# Patient Record
Sex: Male | Born: 1980 | Race: White | Hispanic: No | Marital: Married | Smoking: Current every day smoker
Health system: Southern US, Community
[De-identification: ages and names within clinical notes are randomized; demographics above are authoritative.]

## PROBLEM LIST (undated history)

## (undated) DIAGNOSIS — I10 Essential (primary) hypertension: Secondary | ICD-10-CM

## (undated) HISTORY — PX: WRIST SURGERY: SHX841

---

## 2011-10-07 ENCOUNTER — Emergency Department (HOSPITAL_COMMUNITY): Payer: Self-pay

## 2011-10-07 ENCOUNTER — Emergency Department (HOSPITAL_COMMUNITY)
Admission: EM | Admit: 2011-10-07 | Discharge: 2011-10-07 | Disposition: A | Discharge status: Home / Self Care | Payer: Self-pay | Attending: Emergency Medicine | Admitting: Emergency Medicine

## 2011-10-07 ENCOUNTER — Encounter (HOSPITAL_COMMUNITY): Payer: Self-pay | Admitting: Emergency Medicine

## 2011-10-07 DIAGNOSIS — R000 Tachycardia, unspecified: Secondary | ICD-10-CM | POA: Insufficient documentation

## 2011-10-07 DIAGNOSIS — R0781 Pleurodynia: Secondary | ICD-10-CM

## 2011-10-07 DIAGNOSIS — X58XXXA Exposure to other specified factors, initial encounter: Secondary | ICD-10-CM | POA: Insufficient documentation

## 2011-10-07 DIAGNOSIS — S298XXA Other specified injuries of thorax, initial encounter: Secondary | ICD-10-CM | POA: Insufficient documentation

## 2011-10-07 DIAGNOSIS — Z880 Allergy status to penicillin: Secondary | ICD-10-CM | POA: Insufficient documentation

## 2011-10-07 HISTORY — DX: Essential (primary) hypertension: I10

## 2011-10-07 MED ORDER — IBUPROFEN 800 MG PO TABS
800.0000 mg | ORAL_TABLET | Freq: Once | ORAL | Status: AC
  Administered 2011-10-07: 800 mg via ORAL
  Filled 2011-10-07: qty 1

## 2011-10-07 MED ORDER — IBUPROFEN 800 MG PO TABS: 800.0000 mg | ORAL_TABLET | Freq: Three times a day (TID) | ORAL | Status: AC | PRN

## 2011-10-07 MED ORDER — OXYCODONE HCL 5 MG PO TABS: 5.0000 mg | ORAL_TABLET | Freq: Four times a day (QID) | ORAL | Status: AC | PRN

## 2011-10-07 MED ORDER — OXYCODONE HCL 5 MG PO TABS
10.0000 mg | ORAL_TABLET | Freq: Once | ORAL | Status: AC
  Administered 2011-10-07: 10 mg via ORAL
  Filled 2011-10-07: qty 2
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
## 2011-10-07 NOTE — Discharge Instructions (Signed)
Please read and follow all provided instructions.  Your diagnoses today include:  1. Rib pain    Tests performed today include:  Rib x-ray which did not show any broken bones.   Vital signs. See below for your results today.   Medications prescribed:   Oxycodone - narcotic pain medication  You have been prescribed narcotic pain medication such as Vicodin or Percocet: DO NOT drive or perform any activities that require you to be awake and alert because this medicine can make you drowsy. BE VERY CAREFUL not to take multiple medicines containing Tylenol (also called acetaminophen). Doing so can lead to an overdose which can damage your liver and cause liver failure and possibly death.    Ibuprofen - anti-inflammatory pain medication  Do not exceed 800mg  ibuprofen every 8 hours  You have been prescribed an anti-inflammatory medication or NSAID. Take with food. Take smallest effective dose for the shortest duration needed for your pain. Stop taking if you experience stomach pain or vomiting.   Take any prescribed medications only as directed.  Home care instructions:  Follow any educational materials contained in this packet.  Take 10 deep breaths every hour while awake to prevent pneumonia.   Follow-up instructions: Please follow-up with your primary care provider in the next 3 days for further evaluation of your symptoms. If you do not have a primary care doctor -- see below for referral information.   Return instructions:   Please return to the Emergency Department if you experience worsening symptoms.   Please return if you have any other emergent concerns.  Additional Information:  Your vital signs today were: BP 133/75  Pulse 111  Temp(Src) 97.9 F (36.6 C) (Oral)  Resp 20  SpO2 97% If your blood pressure (BP) was elevated above 135/85 this visit, please have this repeated by your doctor within one month. -------------- No Primary Care Doctor Call [facility]   [phone] Other agencies that provide inexpensive medical care    [facility]  [phone]    [facility]  [phone]    [facility]  [phone]    [facility]  [phone]    [facility]  [phone]    [facility]  [phone] -------------- RESOURCE GUIDE:  Dental Problems  Patients with Medicaid: [facility] [id number]                                            [phone] [address] [employer] Phone:  [phone]                                                   Phone:  [phone]  If unable to pay or uninsured, contact:  [facility] or [facility]. to become qualified for the adult dental clinic.  Chronic Pain Problems Contact [facility]  [phone] Patients need to be referred by their primary care doctor.  Insufficient Money for Medicine Contact [employer]:  call "211" or [facility] [phone].  Psychological Services [facility]  [phone] [facility]  [phone] [facility]   [phone] (emergency services [phone])  Substance Abuse Resources [facility]  [phone] [facility] [phone] [facility] [phone] Floydene Flock [phone] Residential & Outpatient Substance Abuse Program  [phone]  Abuse/Neglect [facility] Child Abuse Hotline [phone] [facility] Child Abuse Hotline [phone] (After Hours)  Emergency Shelter [facility] [phone]  Maternity Homes [facility] at the [place] [facility] [phone] [facility] [phone]  [facility]  [facility]     [employer]                          [facility] [address] [place]                       [address]      [address]  Blondell Reveal Phone:  [phone]                                   Phone:  [phone]                 Phone:  [phone]  [facility] Phone:  [phone]  [facility] Child Abuse Hotline [phone] [phone] (After Hours)
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
## 2011-10-09 NOTE — ED Provider Notes (Signed)
Medical screening examination/treatment/procedure(s) were performed by non-physician practitioner and as supervising physician I was immediately available for consultation/collaboration.   Arrow Emmerich A Rick Warnick, MD 10/09/11 0745 

## 2011-11-26 ENCOUNTER — Encounter (HOSPITAL_COMMUNITY): Payer: Self-pay | Admitting: *Deleted

## 2011-11-26 ENCOUNTER — Emergency Department (HOSPITAL_COMMUNITY)
Admission: EM | Admit: 2011-11-26 | Discharge: 2011-11-26 | Disposition: A | Discharge status: Home / Self Care | Payer: Self-pay | Attending: Emergency Medicine | Admitting: Emergency Medicine

## 2011-11-26 DIAGNOSIS — L0390 Cellulitis, unspecified: Secondary | ICD-10-CM

## 2011-11-26 DIAGNOSIS — F17200 Nicotine dependence, unspecified, uncomplicated: Secondary | ICD-10-CM | POA: Insufficient documentation

## 2011-11-26 DIAGNOSIS — L02519 Cutaneous abscess of unspecified hand: Secondary | ICD-10-CM | POA: Insufficient documentation

## 2011-11-26 DIAGNOSIS — I10 Essential (primary) hypertension: Secondary | ICD-10-CM | POA: Insufficient documentation

## 2011-11-26 MED ORDER — SULFAMETHOXAZOLE-TMP DS 800-160 MG PO TABS
1.0000 | ORAL_TABLET | Freq: Once | ORAL | Status: AC
  Administered 2011-11-26: 1 via ORAL
  Filled 2011-11-26: qty 1

## 2011-11-26 MED ORDER — SULFAMETHOXAZOLE-TRIMETHOPRIM 800-160 MG PO TABS: 1.0000 | ORAL_TABLET | Freq: Two times a day (BID) | ORAL | Status: AC

## 2011-11-26 NOTE — ED Provider Notes (Signed)
History     CSN: [id number]  Arrival date & time 11/26/11  4098   First MD Initiated Contact with Patient 11/26/11 [phone]      Chief Complaint  Patient presents with  . Insect Bite    (Consider location/radiation/quality/duration/timing/severity/associated sxs/prior treatment) HPI Comments: Patient presents with complaint of right wrist redness and mild pain that began yesterday. Patient noted a blister filled with clear fluid which he "popped". Patient has noticed increasing mild redness and warmth around that area. He denies fever, nausea, vomiting, axillary pain or lymphadenopathy. Patient has some pain with movement of his wrist but has full range of motion. Nothing makes symptoms better. Treatments prior to arrival. Onset was acute. Course is gradually worsening.  Patient is a 31 y.o. male presenting with rash. The history is provided by the patient.  Rash  This is a new problem. The current episode started yesterday. The problem has been gradually worsening. There has been no fever. The rash is present on the right wrist. The pain is mild. The pain has been constant since onset. Associated symptoms include blisters and pain. Pertinent negatives include no itching and no weeping. He has tried nothing for the symptoms. The treatment provided no relief.    Past Medical History  Diagnosis Date  . Hypertension     History reviewed. No pertinent past surgical history.  No family history on file.  History  Substance Use Topics  . Smoking status: Current Everyday Smoker  . Smokeless tobacco: Not on file  . Alcohol Use: No      Review of Systems  Constitutional: Negative for fever.  HENT: Negative for sore throat and rhinorrhea.   Eyes: Negative for redness.  Respiratory: Negative for cough.   Cardiovascular: Negative for chest pain.  Gastrointestinal: Negative for nausea, vomiting, abdominal pain and diarrhea.  Genitourinary: Negative for dysuria.  Musculoskeletal:  Negative for myalgias.  Skin: Positive for rash. Negative for itching.  Neurological: Negative for headaches.    Allergies  Penicillins and Tylenol  Home Medications   Current Outpatient Rx  Name Route Sig Dispense Refill  . GOODY HEADACHE PO Oral Take 0.5-1 packets by mouth daily as needed. For head ache    . IBUPROFEN 200 MG PO TABS Oral Take 400 mg by mouth every 8 (eight) hours as needed. For pain.    . SULFAMETHOXAZOLE-TRIMETHOPRIM 800-160 MG PO TABS Oral Take 1 tablet by mouth every 12 (twelve) hours. 14 tablet 0    BP 126/73  Pulse 95  Resp 16  SpO2 99%  Physical Exam  Nursing note and vitals reviewed. Constitutional: He appears well-developed and well-nourished.  HENT:  Head: Normocephalic and atraumatic.  Eyes: Conjunctivae are normal. Right eye exhibits no discharge. Left eye exhibits no discharge.  Neck: Normal range of motion. Neck supple.  Musculoskeletal: He exhibits tenderness.       Right wrist: He exhibits tenderness. He exhibits normal range of motion.       Less 1 cm, circular, erythematous induration with several centimeters of pink surrounding erythema that is mildly warm to touch over dorsum of R wrist.   Neurological: He is alert.  Skin: Skin is warm and dry.  Psychiatric: He has a normal mood and affect.    ED Course  Procedures (including critical care time)  Labs Reviewed - No data to display No results found.   1. Cellulitis    9:36 AM Patient seen and examined. Medications ordered.   Vital signs reviewed and are as follows:  Filed Vitals:   11/26/11 0908  BP: 126/73  Pulse: 95  Resp: 16   9:36 AM Pt urged to return with worsening pain, worsening swelling, expanding area of redness or streaking up extremity, fever, or any other concerns. Urged to take complete course of antibiotics as prescribed. Pt verbalizes understanding and agrees with plan.   MDM  Mild, localized cellulitis. No systemic symptoms. Antibiotics given in the ED and  for home. Patient given return instructions. No contraindications to outpatient therapy. No concern for cytotoxic reaction from spider bite.   [place], [place] 11/26/11 [phone]

## 2011-11-26 NOTE — ED Notes (Signed)
Pt reports he was bit by something yesterday am. Has redness/swelling to right wrist. Small amount of drainage noted.

## 2011-11-27 NOTE — ED Provider Notes (Signed)
Medical screening examination/treatment/procedure(s) were conducted as a shared visit with non-physician practitioner(s) and myself.  I personally evaluated the patient during the encounter.    Physical exam consistent with local cellulitis on right wrist. By mouth antibiotics. No I&D necessary  Donnetta Hutching, MD 11/27/11 1115

## 2011-11-28 ENCOUNTER — Emergency Department (HOSPITAL_COMMUNITY)
Admission: EM | Admit: 2011-11-28 | Discharge: 2011-11-29 | Discharge status: Against Medical Advice | Payer: Self-pay | Attending: Emergency Medicine | Admitting: Emergency Medicine

## 2011-11-28 ENCOUNTER — Encounter (HOSPITAL_COMMUNITY): Payer: Self-pay | Admitting: *Deleted

## 2011-11-28 DIAGNOSIS — M79609 Pain in unspecified limb: Secondary | ICD-10-CM | POA: Insufficient documentation

## 2011-11-28 DIAGNOSIS — M7989 Other specified soft tissue disorders: Secondary | ICD-10-CM | POA: Insufficient documentation

## 2011-11-28 NOTE — ED Notes (Signed)
Called pt twice to rechk vitals, no answer. 

## 2011-11-28 NOTE — ED Notes (Signed)
Patient reports he thought he had an ant bite on his arm on Wed,  Thursday he has swelling in the same area on the lateral right wrist.  He was seen at [facility] for same on Friday.  Patient is taking antibiotics w/o relief.  The area is raised and red,  He states the pain is so bad he cannot work or use the hand.  He has swelling noted in the hand up the anterior forearm.  Redness noted along with swelling track

## 2011-11-28 NOTE — ED Notes (Signed)
Called pt for blood draw no answer

## 2011-11-29 ENCOUNTER — Inpatient Hospital Stay (HOSPITAL_COMMUNITY)
Admission: EM | Admit: 2011-11-29 | Discharge: 2011-12-02 | DRG: 603 | Disposition: A | Discharge status: Home / Self Care | Payer: MEDICAID | Attending: Orthopaedic Surgery | Admitting: Orthopaedic Surgery

## 2011-11-29 ENCOUNTER — Encounter (HOSPITAL_COMMUNITY)
Admission: EM | Disposition: A | Discharge status: Home / Self Care | Payer: Self-pay | Source: Home / Self Care | Attending: Orthopaedic Surgery

## 2011-11-29 ENCOUNTER — Emergency Department (HOSPITAL_COMMUNITY): Payer: Self-pay | Admitting: Anesthesiology

## 2011-11-29 ENCOUNTER — Encounter (HOSPITAL_COMMUNITY): Payer: Self-pay | Admitting: Anesthesiology

## 2011-11-29 ENCOUNTER — Encounter (HOSPITAL_COMMUNITY): Payer: Self-pay | Admitting: Emergency Medicine

## 2011-11-29 DIAGNOSIS — L02519 Cutaneous abscess of unspecified hand: Principal | ICD-10-CM | POA: Diagnosis present

## 2011-11-29 DIAGNOSIS — M71031 Abscess of bursa, right wrist: Secondary | ICD-10-CM

## 2011-11-29 DIAGNOSIS — IMO0002 Reserved for concepts with insufficient information to code with codable children: Secondary | ICD-10-CM | POA: Diagnosis present

## 2011-11-29 DIAGNOSIS — Z880 Allergy status to penicillin: Secondary | ICD-10-CM

## 2011-11-29 DIAGNOSIS — I10 Essential (primary) hypertension: Secondary | ICD-10-CM | POA: Diagnosis present

## 2011-11-29 DIAGNOSIS — L03119 Cellulitis of unspecified part of limb: Principal | ICD-10-CM | POA: Diagnosis present

## 2011-11-29 DIAGNOSIS — F17200 Nicotine dependence, unspecified, uncomplicated: Secondary | ICD-10-CM | POA: Diagnosis present

## 2011-11-29 DIAGNOSIS — M25539 Pain in unspecified wrist: Secondary | ICD-10-CM | POA: Diagnosis present

## 2011-11-29 HISTORY — PX: I & D EXTREMITY: SHX5045

## 2011-11-29 LAB — COMPREHENSIVE METABOLIC PANEL
AST: 17 U/L (ref 0–37)
Alkaline Phosphatase: 84 U/L (ref 39–117)
CO2: 27 mEq/L (ref 19–32)
Chloride: 99 mEq/L (ref 96–112)
Creatinine, Ser: 0.68 mg/dL (ref 0.50–1.35)
GFR calc non Af Amer: >90 mL/min (ref >90)
Total Bilirubin: 0.3 mg/dL (ref 0.3–1.2)

## 2011-11-29 LAB — CBC WITH DIFFERENTIAL/PLATELET
Eosinophils Absolute: 0.4 10*3/uL (ref 0.0–0.7)
Eosinophils Relative: 3 % (ref 0–5)
Hemoglobin: 13.8 g/dL (ref 13.0–17.0)
Lymphs Abs: 3 10*3/uL (ref 0.7–4.0)
MCH: 29.2 pg (ref 26.0–34.0)
MCV: 85 fL (ref 78.0–100.0)
Monocytes Relative: 10 % (ref 3–12)
Neutrophils Relative %: 66 % (ref 43–77)
RBC: 4.73 MIL/uL (ref 4.22–5.81)

## 2011-11-29 SURGERY — IRRIGATION AND DEBRIDEMENT EXTREMITY
Anesthesia: General | Site: Arm Lower | Laterality: Right | Wound class: Dirty or Infected

## 2011-11-29 MED ORDER — VANCOMYCIN HCL IN DEXTROSE 1-5 GM/200ML-% IV SOLN
1000.0000 mg | Freq: Three times a day (TID) | INTRAVENOUS | Status: DC
  Administered 2011-11-29 – 2011-12-01 (×5): 1000 mg via INTRAVENOUS
  Filled 2011-11-29 (×8): qty 200

## 2011-11-29 MED ORDER — LACTATED RINGERS IV SOLN
INTRAVENOUS | Status: DC | PRN
  Administered 2011-11-29: 19:00:00 via INTRAVENOUS

## 2011-11-29 MED ORDER — METHOCARBAMOL 100 MG/ML IJ SOLN
500.0000 mg | Freq: Four times a day (QID) | INTRAMUSCULAR | Status: DC | PRN
  Filled 2011-11-29: qty 5

## 2011-11-29 MED ORDER — OXYCODONE HCL 5 MG PO TABS
5.0000 mg | ORAL_TABLET | ORAL | Status: DC | PRN
  Administered 2011-12-02 (×2): 10 mg via ORAL
  Filled 2011-11-29 (×2): qty 2

## 2011-11-29 MED ORDER — MORPHINE SULFATE 2 MG/ML IJ SOLN
1.0000 mg | INTRAMUSCULAR | Status: DC | PRN
  Administered 2011-11-29 (×2): 1 mg via INTRAVENOUS
  Filled 2011-11-29: qty 1

## 2011-11-29 MED ORDER — METOCLOPRAMIDE HCL 5 MG/ML IJ SOLN: 5.0000 mg | Freq: Three times a day (TID) | INTRAMUSCULAR | Status: DC | PRN

## 2011-11-29 MED ORDER — ONDANSETRON HCL 4 MG PO TABS: 4.0000 mg | ORAL_TABLET | Freq: Four times a day (QID) | ORAL | Status: DC | PRN

## 2011-11-29 MED ORDER — ONDANSETRON HCL 4 MG/2ML IJ SOLN
4.0000 mg | Freq: Four times a day (QID) | INTRAMUSCULAR | Status: DC | PRN
  Administered 2011-11-29: 4 mg via INTRAVENOUS
  Filled 2011-11-29: qty 2

## 2011-11-29 MED ORDER — VANCOMYCIN HCL IN DEXTROSE 1-5 GM/200ML-% IV SOLN
1000.0000 mg | Freq: Once | INTRAVENOUS | Status: AC
  Administered 2011-11-29: 1000 mg via INTRAVENOUS
  Filled 2011-11-29: qty 200

## 2011-11-29 MED ORDER — ONDANSETRON HCL 4 MG/2ML IJ SOLN
INTRAMUSCULAR | Status: DC | PRN
  Administered 2011-11-29: 4 mg via INTRAVENOUS

## 2011-11-29 MED ORDER — METHOCARBAMOL 500 MG PO TABS: 500.0000 mg | ORAL_TABLET | Freq: Four times a day (QID) | ORAL | Status: DC | PRN

## 2011-11-29 MED ORDER — LACTATED RINGERS IV SOLN: INTRAVENOUS | Status: DC

## 2011-11-29 MED ORDER — MORPHINE SULFATE 4 MG/ML IJ SOLN
4.0000 mg | Freq: Once | INTRAMUSCULAR | Status: AC
  Administered 2011-11-29: 4 mg via INTRAVENOUS
  Filled 2011-11-29: qty 1

## 2011-11-29 MED ORDER — FENTANYL CITRATE 0.05 MG/ML IJ SOLN: 25.0000 ug | INTRAMUSCULAR | Status: DC | PRN

## 2011-11-29 MED ORDER — DIPHENHYDRAMINE HCL 12.5 MG/5ML PO ELIX: 12.5000 mg | ORAL_SOLUTION | ORAL | Status: DC | PRN

## 2011-11-29 MED ORDER — ONDANSETRON HCL 4 MG/2ML IJ SOLN
4.0000 mg | Freq: Once | INTRAMUSCULAR | Status: AC
  Administered 2011-11-29: 4 mg via INTRAVENOUS
  Filled 2011-11-29: qty 2

## 2011-11-29 MED ORDER — SODIUM CHLORIDE 0.9 % IV SOLN
Freq: Once | INTRAVENOUS | Status: AC
Start: 2011-11-29 — End: 2011-11-29
  Administered 2011-11-29: 17:00:00 via INTRAVENOUS

## 2011-11-29 MED ORDER — ONDANSETRON HCL 4 MG/2ML IJ SOLN: 4.0000 mg | Freq: Four times a day (QID) | INTRAMUSCULAR | Status: DC | PRN

## 2011-11-29 MED ORDER — HYDROCODONE-ACETAMINOPHEN 5-325 MG PO TABS
1.0000 | ORAL_TABLET | ORAL | Status: DC | PRN
  Administered 2011-11-30 – 2011-12-01 (×2): 1 via ORAL
  Administered 2011-12-01: 2 via ORAL
  Filled 2011-11-29: qty 2
  Filled 2011-11-29 (×2): qty 1

## 2011-11-29 MED ORDER — METOCLOPRAMIDE HCL 5 MG/ML IJ SOLN
INTRAMUSCULAR | Status: DC | PRN
  Administered 2011-11-29: 10 mg via INTRAVENOUS

## 2011-11-29 MED ORDER — PROMETHAZINE HCL 25 MG/ML IJ SOLN: 6.2500 mg | INTRAMUSCULAR | Status: DC | PRN

## 2011-11-29 MED ORDER — ZOLPIDEM TARTRATE 5 MG PO TABS: 5.0000 mg | ORAL_TABLET | Freq: Every evening | ORAL | Status: DC | PRN

## 2011-11-29 MED ORDER — FENTANYL CITRATE 0.05 MG/ML IJ SOLN
INTRAMUSCULAR | Status: DC | PRN
  Administered 2011-11-29 (×3): 25 ug via INTRAVENOUS

## 2011-11-29 MED ORDER — ONDANSETRON HCL 4 MG/2ML IJ SOLN: 4.0000 mg | Freq: Once | INTRAMUSCULAR | Status: DC

## 2011-11-29 MED ORDER — METOCLOPRAMIDE HCL 10 MG PO TABS: 5.0000 mg | ORAL_TABLET | Freq: Three times a day (TID) | ORAL | Status: DC | PRN

## 2011-11-29 MED ORDER — MIDAZOLAM HCL 5 MG/5ML IJ SOLN
INTRAMUSCULAR | Status: DC | PRN
  Administered 2011-11-29: 2 mg via INTRAVENOUS

## 2011-11-29 MED ORDER — SODIUM CHLORIDE 0.9 % IV SOLN
INTRAVENOUS | Status: DC
  Administered 2011-11-29: 20 mL/h via INTRAVENOUS

## 2011-11-29 MED ORDER — BUPIVACAINE HCL (PF) 0.5 % IJ SOLN
INTRAMUSCULAR | Status: AC
  Filled 2011-11-29: qty 30

## 2011-11-29 MED ORDER — HYDROMORPHONE HCL PF 1 MG/ML IJ SOLN: 0.2500 mg | INTRAMUSCULAR | Status: DC | PRN

## 2011-11-29 MED ORDER — SODIUM CHLORIDE 0.9 % IR SOLN
Status: DC | PRN
  Administered 2011-11-29: 3000 mL

## 2011-11-29 MED ORDER — MORPHINE SULFATE 2 MG/ML IJ SOLN: 2.0000 mg | INTRAMUSCULAR | Status: DC | PRN

## 2011-11-29 MED ORDER — SODIUM CHLORIDE 0.9 % IR SOLN
Status: DC | PRN
  Administered 2011-11-29: 20:00:00

## 2011-11-29 SURGICAL SUPPLY — 29 items
BAG ZIPLOCK 12X15 (MISCELLANEOUS) ×2 IMPLANT
BANDAGE ESMARK 6X9 LF (GAUZE/BANDAGES/DRESSINGS) ×1 IMPLANT
BANDAGE GAUZE ELAST BULKY 4 IN (GAUZE/BANDAGES/DRESSINGS) ×2 IMPLANT
BNDG COHESIVE 4X5 TAN STRL (GAUZE/BANDAGES/DRESSINGS) ×2 IMPLANT
BNDG ESMARK 6X9 LF (GAUZE/BANDAGES/DRESSINGS) ×2
CLOTH BEACON ORANGE TIMEOUT ST (SAFETY) ×2 IMPLANT
CUFF TOURN SGL QUICK 18 (TOURNIQUET CUFF) IMPLANT
CUFF TOURN SGL QUICK 24 (TOURNIQUET CUFF)
CUFF TOURN SGL QUICK 34 (TOURNIQUET CUFF)
CUFF TRNQT CYL 24X4X40X1 (TOURNIQUET CUFF) IMPLANT
CUFF TRNQT CYL 34X4X40X1 (TOURNIQUET CUFF) IMPLANT
DRAIN PENROSE 18X1/2 LTX STRL (DRAIN) ×2 IMPLANT
DRSG PAD ABDOMINAL 8X10 ST (GAUZE/BANDAGES/DRESSINGS) ×4 IMPLANT
DURAPREP 26ML APPLICATOR (WOUND CARE) ×2 IMPLANT
ELECT REM PT RETURN 9FT ADLT (ELECTROSURGICAL) ×2
ELECTRODE REM PT RTRN 9FT ADLT (ELECTROSURGICAL) ×1 IMPLANT
GAUZE XEROFORM 1X8 LF (GAUZE/BANDAGES/DRESSINGS) ×2 IMPLANT
GLOVE BIO SURGEON STRL SZ7.5 (GLOVE) ×2 IMPLANT
GOWN STRL REIN XL XLG (GOWN DISPOSABLE) ×2 IMPLANT
HANDPIECE INTERPULSE COAX TIP (DISPOSABLE) ×1
KIT BASIN OR (CUSTOM PROCEDURE TRAY) ×2 IMPLANT
PACK LOWER EXTREMITY WL (CUSTOM PROCEDURE TRAY) ×2 IMPLANT
PAD CAST 4YDX4 CTTN HI CHSV (CAST SUPPLIES) ×1 IMPLANT
PADDING CAST COTTON 4X4 STRL (CAST SUPPLIES) ×1
POSITIONER SURGICAL ARM (MISCELLANEOUS) ×2 IMPLANT
SET HNDPC FAN SPRY TIP SCT (DISPOSABLE) ×1 IMPLANT
SPONGE GAUZE 4X4 12PLY (GAUZE/BANDAGES/DRESSINGS) ×2 IMPLANT
SYR CONTROL 10ML LL (SYRINGE) ×2 IMPLANT
TOWEL OR 17X26 10 PK STRL BLUE (TOWEL DISPOSABLE) ×4 IMPLANT

## 2011-11-29 NOTE — ED Notes (Signed)
Awaiting hand surgeon consult

## 2011-11-29 NOTE — ED Notes (Signed)
MD at bedside. 

## 2011-11-29 NOTE — ED Notes (Signed)
Pt presenting to ed with c/o abscess to right wrist pt states seen here for the same x 3 days ago but pain, redness and swelling are worse. Pt is alert and oriented at this time

## 2011-11-29 NOTE — Anesthesia Postprocedure Evaluation (Signed)
  Anesthesia Post-op Note  Patient: [name] [name]  Procedure(s) Performed: Procedure(s) (LRB): IRRIGATION AND DEBRIDEMENT EXTREMITY (Right)  Patient Location: PACU  Anesthesia Type: General  Level of Consciousness: awake and alert   Airway and Oxygen Therapy: Patient Spontanous Breathing  Post-op Pain: mild  Post-op Assessment: Post-op Vital signs reviewed, Patient's Cardiovascular Status Stable, Respiratory Function Stable, Patent Airway and No signs of Nausea or vomiting  Post-op Vital Signs: stable  Complications: No apparent anesthesia complications

## 2011-11-29 NOTE — H&P (Signed)
[name] [name] is an 31 y.o. male.   Chief Complaint:  Cellulitis and abscess of right wrist. HPI: Pt noted a blister like lesion on right wrist Thursday and burst the lesion expressing clear fluid.  On Friday the area became inflamed and painful.  He presented to the ED and was treated with oral Septra.  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  On Saturday he tried to rest and elevate hand.  Returned to the ED on Monday when the edema began extending into his hand and forearm and wound began draining pus.  Pt reports no injury to this area but thought perhaps he was bitten by an insect.  He recalls killing a spider in his hotel room earlier this week when he was working out of town.  He feels it may have bitten him.  He can only describe the spider as brown with black legs the size of his thumbnail and "it did not have a red hour glass on its back".  He also has been working outdoors in areas of tall brush.   Past Medical History  Diagnosis Date  . Hypertension     History reviewed. No pertinent past surgical history.  No family history on file. Social History:  reports that he has been smoking.  He does not have any smokeless tobacco history on file. He reports that he does not drink alcohol or use illicit drugs. Pt has an 57 month old daughter who is in his custody. He is separated from his wife.  Allergies:  Allergies  Allergen Reactions  . Penicillins Nausea And Vomiting    Turns green  . Tylenol (Acetaminophen) Nausea And Vomiting     (Not in a hospital admission)  Results for orders placed during the hospital encounter of 11/29/11 (from the past 48 hour(s))  CBC WITH DIFFERENTIAL     Status: Abnormal   Collection Time   11/29/11 12:10 PM      Component Value Range Comment   WBC 14.7 (*) 4.0 - 10.5 K/uL    RBC 4.73  4.22 - 5.81 MIL/uL    Hemoglobin 13.8  13.0 - 17.0 g/dL    HCT 62.1  30.8 - 65.7 %    MCV 85.0  78.0 - 100.0 fL    MCH 29.2  26.0 -  34.0 pg    MCHC 34.3  30.0 - 36.0 g/dL    RDW 84.6  96.2 - 95.2 %    Platelets 233  150 - 400 K/uL    Neutrophils Relative 66  43 - 77 %    Neutro Abs 9.7 (*) 1.7 - 7.7 K/uL    Lymphocytes Relative 21  12 - 46 %    Lymphs Abs 3.0  0.7 - 4.0 K/uL    Monocytes Relative 10  3 - 12 %    Monocytes Absolute 1.5 (*) 0.1 - 1.0 K/uL    Eosinophils Relative 3  0 - 5 %    Eosinophils Absolute 0.4  0.0 - 0.7 K/uL    Basophils Relative 0  0 - 1 %    Basophils Absolute 0.1  0.0 - 0.1 K/uL    WBC Morphology TOXIC GRANULATION      Smear Review PLATELET COUNT CONFIRMED BY SMEAR     COMPREHENSIVE METABOLIC PANEL     Status: Abnormal   Collection Time   11/29/11 12:10 PM      Component Value Range Comment   Sodium  136  135 - 145 mEq/L    Potassium 3.3 (*) 3.5 - 5.1 mEq/L    Chloride 99  96 - 112 mEq/L    CO2 27  19 - 32 mEq/L    Glucose, Bld 99  70 - 99 mg/dL    BUN 5 (*) 6 - 23 mg/dL    Creatinine, Ser 8.65  0.50 - 1.35 mg/dL    Calcium 9.1  8.4 - 78.4 mg/dL    Total Protein 7.2  6.0 - 8.3 g/dL    Albumin 3.7  3.5 - 5.2 g/dL    AST 17  0 - 37 U/L    ALT 14  0 - 53 U/L    Alkaline Phosphatase 84  39 - 117 U/L    Total Bilirubin 0.3  0.3 - 1.2 mg/dL    GFR calc non Af Amer >90  >90 mL/min    GFR calc Af Amer >90  >90 mL/min    No results found.  Review of Systems  Constitutional: Positive for fever.  HENT: Negative.   Eyes: Negative.   Respiratory: Negative.   Cardiovascular: Negative.   Gastrointestinal: Negative.   Genitourinary: Negative.   Musculoskeletal: Negative.   Skin:       Redness and swelling at right wrist extending into forearm and hand since 2 days ago. Tender and sore in the axilla on the right.  Neurological: Positive for tingling.       Tingling in the right thumb and fingers which has improved today and now just in the tip of the right thumb  Endo/Heme/Allergies: Negative.   Psychiatric/Behavioral: Negative.     Blood pressure 127/82, pulse 110, temperature 98.6  F (37 C), temperature source Oral, resp. rate 20, SpO2 99.00%. Physical Exam  Constitutional: He is oriented to person, place, and time. He appears well-developed and well-nourished.  HENT:  Head: Normocephalic and atraumatic.  Eyes: EOM are normal. Pupils are equal, round, and reactive to light.  Neck: Normal range of motion. Neck supple.  Cardiovascular: Normal rate and regular rhythm.   Respiratory: Effort normal and breath sounds normal.  GI: Soft. Bowel sounds are normal.  Musculoskeletal:       Right wrist with large tense area of erythema and edema. (app 5cm x 2cm).  Abscess over radial aspect of wrist which is weeping and has some purulent matter express from central area of excoriation.  Forearm with edema,but little erythema.  No streaking into the upper arm.  Tender with palpation in the right axilla.  No palpable nodes in the cervical or axillary region. Subjective decrease sensation of the right thumb tip.  Dorsum of hand edematous.  Pt can flex and extend fingers well.  Pain with flex and ext of wrist. Radial pulse intact. Capillary refill of fingers intact.  Wound dressed with xeroform gauze, 4x4's and kerlex after examination.  Neurological: He is alert and oriented to person, place, and time.  Psychiatric: He has a normal mood and affect.     Assessment/Plan Cellulitis and abscess of right wrist with extension into forearm and hand.  Early lymphangitis.  Possible spider or insect bite.  Plan Will admit pt to hospital for Irrigation and debridement of abscess today by DR Magnus Ivan.  Pt placed in mission sling for elevation and IV Vancomycin and Zosyn started per pharmacy protocol.  Kolden Dupee M 11/29/2011, 3:20 PM

## 2011-11-29 NOTE — ED Notes (Signed)
Family at bedside. 

## 2011-11-29 NOTE — Progress Notes (Signed)
ANTIBIOTIC CONSULT NOTE - INITIAL  Pharmacy Consult for Vancomycin Indication: Wrist cellulitis and abscess  Allergies  Allergen Reactions  . Penicillins Nausea And Vomiting    Turns green  . Tylenol (Acetaminophen) Nausea And Vomiting   Patient Measurements: Height: 5' 10.87" (180 cm) (noted in chart on 7/28) Weight: 184 lb 15.5 oz (83.9 kg) (Noted in chart on 11/28/11) IBW/kg (Calculated) : 74.99   Vital Signs: Temp: 98.6 F (37 C) (07/29 2120) Temp src: Oral (07/29 1138) BP: 118/79 mmHg (07/29 2120) Pulse Rate: 88  (07/29 2120) Intake/Output from previous day:   Intake/Output from this shift: Total I/O In: 600 [I.V.:600] Out: -   Labs:  Basename 11/29/11 1210  WBC 14.7*  HGB 13.8  PLT 233  LABCREA --  CREATININE 0.68   Estimated Creatinine Clearance: 141.9 ml/min (by C-G formula based on Cr of 0.68). No results found for this basename: VANCOTROUGH:2,VANCOPEAK:2,VANCORANDOM:2,GENTTROUGH:2,GENTPEAK:2,GENTRANDOM:2,TOBRATROUGH:2,TOBRAPEAK:2,TOBRARND:2,AMIKACINPEAK:2,AMIKACINTROU:2,AMIKACIN:2, in the last 72 hours   Microbiology: No results found for this or any previous visit (from the past 720 hour(s)).  Medical History: Past Medical History  Diagnosis Date  . Hypertension    Medications:  Anti-infectives     Start     Dose/Rate Route Frequency Ordered Stop   11/29/11 1949   polymyxin B 500,000 Units, bacitracin 50,000 Units in sodium chloride irrigation 0.9 % 500 mL irrigation  Status:  Discontinued          As needed 11/29/11 1950 11/29/11 2006   11/29/11 1300   vancomycin (VANCOCIN) IVPB 1000 mg/200 mL premix        1,000 mg 200 mL/hr over 60 Minutes Intravenous  Once 11/29/11 1247 11/29/11 1505         Assessment:  31yo M with right wrist cellulitis and abscess, possible spider or insect bite. Underwent I&D 7/29.  Starting empiric Vancomycin.  Good renal fxn.  Goal of Therapy:  Vancomycin trough level 15-20 mcg/ml  Plan:  Vancomycin 1g IV  q8h. Measure Vanc trough at steady state. Follow up renal fxn and culture results.  Charolotte Eke, PharmD, pager [phone]. 11/29/2011,10:02 PM.

## 2011-11-29 NOTE — Transfer of Care (Signed)
Immediate Anesthesia Transfer of Care Note  Patient: [name] [name]  Procedure(s) Performed: Procedure(s) (LRB): IRRIGATION AND DEBRIDEMENT EXTREMITY (Right)  Patient Location: PACU  Anesthesia Type: General  Level of Consciousness: sedated, patient cooperative and responds to stimulaton  Airway & Oxygen Therapy: Patient Spontanous Breathing and Patient connected to face mask oxgen  Post-op Assessment: Report given to PACU RN and Post -op Vital signs reviewed and stable  Post vital signs: Reviewed and stable  Complications: No apparent anesthesia complications

## 2011-11-29 NOTE — ED Notes (Signed)
Patient is resting comfortably. 

## 2011-11-29 NOTE — Anesthesia Preprocedure Evaluation (Addendum)
Anesthesia Evaluation  Patient identified by MRN, date of birth, ID band Patient awake    Reviewed: Allergy & Precautions, H&P , NPO status , Patient's Chart, lab work & pertinent test results, reviewed documented beta blocker date and time   Airway Mallampati: II TM Distance: >3 FB Neck ROM: full    Dental No notable dental hx.    Pulmonary neg pulmonary ROS,  breath sounds clear to auscultation  Pulmonary exam normal       Cardiovascular Exercise Tolerance: Good hypertension, Rhythm:regular Rate:Normal     Neuro/Psych negative neurological ROS  negative psych ROS   GI/Hepatic negative GI ROS, Neg liver ROS,   Endo/Other  negative endocrine ROS  Renal/GU negative Renal ROS  negative genitourinary   Musculoskeletal   Abdominal   Peds  Hematology negative hematology ROS (+)   Anesthesia Other Findings   Reproductive/Obstetrics negative OB ROS                           Anesthesia Physical Anesthesia Plan  ASA: II  Anesthesia Plan: General   Post-op Pain Management:    Induction:   Airway Management Planned:   Additional Equipment:   Intra-op Plan:   Post-operative Plan:   Informed Consent: I have reviewed the patients History and Physical, chart, labs and discussed the procedure including the risks, benefits and alternatives for the proposed anesthesia with the patient or authorized representative who has indicated his/her understanding and acceptance.   Dental Advisory Given  Plan Discussed with: CRNA  Anesthesia Plan Comments:         Anesthesia Quick Evaluation

## 2011-11-29 NOTE — ED Provider Notes (Signed)
History     CSN: [id number]  Arrival date & time 11/29/11  1133   First MD Initiated Contact with Patient 11/29/11 1210      Chief Complaint  Patient presents with  . Abscess    (Consider location/radiation/quality/duration/timing/severity/associated sxs/prior treatment) HPI History from patient. 31 year old right-handed male who presents with pain and swelling to his right wrist. He states this started on Thursday. He has had progressive pain, swelling, and redness since that time. He was seen in the emergency department on Friday. He did not have an area amenable to drainage at that time and was started on Bactrim for cellulitis. He reports that he has been taking this as prescribed. He reports that the area has had increased swelling since then and he noticed a red area of streaking going up his arm last evening. He reports that he did make a small cut in the area at home himself last night due to the pressure and pain. This was productive of a small amount of serous drainage. He has not noticed any overtly purulent drainage from the area. He denies fever or chills. States that he did see a spider in his hotel room tonight before noticing the area and is worried that he may been bitten by it.  Past Medical History  Diagnosis Date  . Hypertension     History reviewed. No pertinent past surgical history.  No family history on file.  History  Substance Use Topics  . Smoking status: Current Everyday Smoker  . Smokeless tobacco: Not on file  . Alcohol Use: No      Review of Systems as per HPI  Allergies  Penicillins and Tylenol  Home Medications   Current Outpatient Rx  Name Route Sig Dispense Refill  . GOODY HEADACHE PO Oral Take 0.5-1 packets by mouth daily as needed. For head ache    . IBUPROFEN 200 MG PO TABS Oral Take 400 mg by mouth every 8 (eight) hours as needed. For pain.    . SULFAMETHOXAZOLE-TRIMETHOPRIM 800-160 MG PO TABS Oral Take 1 tablet by mouth every 12  (twelve) hours. 14 tablet 0    BP 127/82  Pulse 110  Temp 98.6 F (37 C) (Oral)  Resp 20  SpO2 99%  Physical Exam  Nursing note and vitals reviewed. Constitutional: He appears well-developed and well-nourished. No distress.  HENT:  Head: Normocephalic and atraumatic.  Neck: Normal range of motion.  Cardiovascular: Tachycardia present.   Pulmonary/Chest: Effort normal.  Musculoskeletal: Normal range of motion.       Large approx 5 cm raised area without obvious fluctuance draining serous material with overlying erythema to radial side of wrist; tender to palpation - pt has generalized edema to wrist and dorsum of hand. NVI to hand in radian, median, ulnar dist, endorses some difficulty moving fingers as states his hand feels "tight" and is painful. Subtle lymphangitis extending nearly to axilla.  Neurological: He is alert.  Skin: Skin is warm and dry. He is not diaphoretic.  Psychiatric: He has a normal mood and affect.    ED Course  Procedures (including critical care time)  Labs Reviewed  CBC WITH DIFFERENTIAL - Abnormal; Notable for the following:    WBC 14.7 (*)     Neutro Abs 9.7 (*)     Monocytes Absolute 1.5 (*)     All other components within normal limits  COMPREHENSIVE METABOLIC PANEL - Abnormal; Notable for the following:    Potassium 3.3 (*)     BUN  5 (*)     All other components within normal limits   No results found.   1. Abscess of bursa, right wrist       MDM  12:48 PM Discussed with Dr. Magnus Ivan. He will see the patient. Pt made aware, resting comfortably at this time. Will manage pain and give IV abx while awaiting consult.   4:00 PM Pt seen by PA, will go to OR for incision and drainage.        Grant Fontana, PA-C 11/29/11 1821

## 2011-11-29 NOTE — ED Notes (Addendum)
Pt said having a lot of pain

## 2011-11-29 NOTE — ED Notes (Signed)
Patient is resting comfortably.  Awaiting dr. Magnus Ivan

## 2011-11-29 NOTE — ED Provider Notes (Addendum)
He complains pain and swelling at right wrist on exam there is approximately 5 cm diameter swollen reddened tender area at radial aspect of wrist patient has pain on active motion of his fingers. Dorsum of right hand is swollen mildly tender. Radial pulse 2+  Doug Sou, MD 11/29/11 1225  Doug Sou, MD 11/29/11 1719

## 2011-11-30 ENCOUNTER — Encounter (HOSPITAL_COMMUNITY): Payer: Self-pay | Admitting: Orthopaedic Surgery

## 2011-11-30 LAB — CBC
MCH: 29.5 pg (ref 26.0–34.0)
MCHC: 34.6 g/dL (ref 30.0–36.0)
MCV: 85.3 fL (ref 78.0–100.0)
Platelets: 215 10*3/uL (ref 150–400)
RBC: 4.95 MIL/uL (ref 4.22–5.81)

## 2011-11-30 LAB — BASIC METABOLIC PANEL
BUN: 4 mg/dL — ABNORMAL LOW (ref 6–23)
CO2: 24 mEq/L (ref 19–32)
Calcium: 9.4 mg/dL (ref 8.4–10.5)
Creatinine, Ser: 0.65 mg/dL (ref 0.50–1.35)
Glucose, Bld: 104 mg/dL — ABNORMAL HIGH (ref 70–99)
Sodium: 135 mEq/L (ref 135–145)

## 2011-11-30 NOTE — Op Note (Signed)
NAME:  SEAB, AXEL NO.:  [id number]  MEDICAL RECORD NO.:  [id number]  LOCATION:  1618                         FACILITY:  [facility]  PHYSICIAN:  Vanita Panda. Magnus Ivan, M.D.DATE OF BIRTH:  [birth year]  DATE OF PROCEDURE:  11/29/2011 DATE OF DISCHARGE:                              OPERATIVE REPORT   PREOPERATIVE DIAGNOSIS:  Right wrist cellulitis and abscess.  POSTOPERATIVE DIAGNOSIS:  Right wrist cellulitis and abscess.  FINDINGS:  Local soft tissue abscess, right radial wrist.  SURGEON:  Vanita Panda. Magnus Ivan, M.D.  ANESTHESIA:  General.  BLOOD LOSS:  Minimal.  TOURNIQUET TIME:  8 minutes.  COMPLICATIONS:  None.  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  He feels like he may have attained some type of spider bite to his radial wrist, which is dominant wrist.  He went to the ER on Friday and they started him on Bactrim double strength.  He returned to the ER today because his wrist had gotten more fluctuant, painful, and red and was started to have pain going up into his axilla.  Orthopedic Surgery was consulted to address the abscess.  On exam, he did have fluctuant area on the radial aspect of his wrist and I felt like he would benefit from IV antibiotics and a formal irrigation and debridement.  PROCEDURE DESCRIPTION:  After informed consent was obtained, appropriate right wrist was marked.  He was brought to the operating range of motion, placed supine on the operative table with the right arm on the arm table.  General anesthesia was then obtained.  A nonsterile tourniquet was placed on his upper right arm and his right hand and wrist, and forearm were prepped and draped with Hibiclens and sterile drapes.  A time-out was called to identify correct patient, correct right wrist.  I then raised the arm and elevated the tourniquet to 250 mmHg.  I made an incision directly over this large  fluctuant area on the radial border of his right wrist, and did encounter gross purulence and necrotic tissue.  We obtained cultures although he had been given a dose of IV vancomycin in the ER.  I then used 3 L of normal saline solution and pulsatile lavage to lavage the wrist followed by 500 mL of bacitracin solution.  I then loosely reapproximated the skin edges with interrupted nylon suture.  I placed Xeroform and well- padded sterile dressing.  He was awakened, extubated, and taken to the recovery room in a stable condition.  All final counts were correct and there were no complications noted.  Postoperatively, we will admit him for IV antibiotics for at least the next 24-48 hours as he is clearing the infection and we can see what the cultures show.     Vanita Panda. Magnus Ivan, M.D.     CYB/MEDQ  D:  11/29/2011  T:  11/30/2011  Job:  [id number]

## 2011-11-30 NOTE — ED Provider Notes (Signed)
Medical screening examination/treatment/procedure(s) were conducted as a shared visit with non-physician practitioner(s) and myself.  I personally evaluated the patient during the encounter  Doug Sou, MD 11/30/11 [phone]

## 2011-11-30 NOTE — Progress Notes (Signed)
Subjective: 1 Day Post-Op Procedure(s) (LRB): IRRIGATION AND DEBRIDEMENT EXTREMITY (Right) Patient reports pain as moderate.  Post I&D of wrist abscess.  Objective: Vital signs in last 24 hours: Temp:  [97.5 F (36.4 C)-99.2 F (37.3 C)] 99.2 F (37.3 C) (07/30 0520) Pulse Rate:  [75-110] 75  (07/30 0520) Resp:  [9-20] 18  (07/30 0520) BP: (100-139)/(62-84) 100/62 mmHg (07/30 0520) SpO2:  [91 %-100 %] 100 % (07/30 0520) Weight:  [83.9 kg (184 lb 15.5 oz)] 83.9 kg (184 lb 15.5 oz) (07/29 2139)  Intake/Output from previous day: 07/29 0701 - 07/30 0700 In: 2010.3 [P.O.:1120; I.V.:690.3; IV Piggyback:200] Out: 900 [Urine:900] Intake/Output this shift: Total I/O In: 2010.3 [P.O.:1120; I.V.:690.3; IV Piggyback:200] Out: 900 [Urine:900]   Basename 11/30/11 0420 11/29/11 1210  HGB 14.6 13.8    Basename 11/30/11 0420 11/29/11 1210  WBC 13.8* 14.7*  RBC 4.95 4.73  HCT 42.2 40.2  PLT 215 233    Basename 11/30/11 0420 11/29/11 1210  NA 135 136  K 4.0 3.3*  CL 100 99  CO2 24 27  BUN 4* 5*  CREATININE 0.65 0.68  GLUCOSE 104* 99  CALCIUM 9.4 9.1   No results found for this basename: LABPT:2,INR:2 in the last 72 hours  Incision: dressing C/D/I  Assessment/Plan: 1 Day Post-Op Procedure(s) (LRB): IRRIGATION AND DEBRIDEMENT EXTREMITY (Right) IV antibiotics the next 24 hours prior to d/c on oral anitbiotics.   Kathryne Hitch 11/30/2011, 6:38 AM

## 2011-12-01 LAB — BASIC METABOLIC PANEL
CO2: 25 mEq/L (ref 19–32)
Calcium: 9.3 mg/dL (ref 8.4–10.5)
Creatinine, Ser: 0.64 mg/dL (ref 0.50–1.35)
GFR calc non Af Amer: >90 mL/min (ref >90)
Glucose, Bld: 128 mg/dL — ABNORMAL HIGH (ref 70–99)

## 2011-12-01 MED ORDER — CLINDAMYCIN HCL 300 MG PO CAPS
600.0000 mg | ORAL_CAPSULE | Freq: Three times a day (TID) | ORAL | Status: DC
  Administered 2011-12-01 – 2011-12-02 (×3): 600 mg via ORAL
  Filled 2011-12-01 (×6): qty 2

## 2011-12-01 NOTE — Progress Notes (Signed)
CARE MANAGEMENT NOTE 12/01/2011  Patient:  [name] [name],[name] [name]   Account Number:  [id number]  Date Initiated:  11/30/2011  Documentation initiated by:  Colleen Can  Subjective/Objective Assessment:   dx cellulitis and abscess of lt wrist. Insect or spider bite     Action/Plan:   CM spoke with patient. Plans to go back to hotel room where he lives alone Pt is without health insurance but works full time job   Anticipated DC Date:  12/02/2011   Anticipated DC Plan:  HOME/SELF CARE  In-house referral  Financial Counselor      DC Planning Services  CM consult      Choice offered to / List presented to:             Status of service:  In process, will continue to follow Per UR Regulation:  Reviewed for med. necessity/level of care/duration of stay

## 2011-12-01 NOTE — Progress Notes (Signed)
Patient ID: [name] [name], male   DOB: [birth year], 31 y.o.   MRN: [id number] Dressing changed at the bedside.  Right wrist wound improving.  No red streaking up his arm like before.  Still some drainage.  Fingers less swollen.  New dressing applied.  Plan:  Conitnue IV antibiotics today and then likely D/C to home tomorrow am 8/1.

## 2011-12-02 LAB — CULTURE, ROUTINE-ABSCESS

## 2011-12-02 LAB — BASIC METABOLIC PANEL
BUN: 10 mg/dL (ref 6–23)
Calcium: 9.1 mg/dL (ref 8.4–10.5)
GFR calc non Af Amer: >90 mL/min (ref >90)
Glucose, Bld: 107 mg/dL — ABNORMAL HIGH (ref 70–99)
Sodium: 137 mEq/L (ref 135–145)

## 2011-12-02 MED ORDER — DOXYCYCLINE HYCLATE 100 MG PO TABS: 100.0000 mg | ORAL_TABLET | Freq: Two times a day (BID) | ORAL | Status: AC

## 2011-12-02 MED ORDER — OXYCODONE HCL 5 MG PO TABS: 5.0000 mg | ORAL_TABLET | Freq: Four times a day (QID) | ORAL | Status: AC | PRN

## 2011-12-02 NOTE — Discharge Summary (Signed)
Patient ID: [name] [name] MRN: [id number] DOB/AGE: 1980/06/14 31 y.o.  Admit date: 11/29/2011 Discharge date: 12/02/2011  Admission Diagnoses:  Principal Problem:  *Abscess or cellulitis of wrist   Discharge Diagnoses:  Same  Past Medical History  Diagnosis Date  . Hypertension     Surgeries: Procedure(s): IRRIGATION AND DEBRIDEMENT EXTREMITY on 11/29/2011   Consultants:    Discharged Condition: Improved  Hospital Course: [name] [name] is an 31 y.o. male who was admitted 11/29/2011 for operative treatment ofAbscess or cellulitis of wrist. Patient has severe unremitting pain that affects sleep, daily activities, and work/hobbies. After pre-op clearance the patient was taken to the operating room on 11/29/2011 and underwent  Procedure(s): IRRIGATION AND DEBRIDEMENT EXTREMITY.    Patient was given perioperative antibiotics: Anti-infectives     Start     Dose/Rate Route Frequency Ordered Stop   12/02/11 0000   doxycycline (VIBRA-TABS) 100 MG tablet        100 mg Oral 2 times daily 12/02/11 0700 12/12/11 2359   12/15/11 1630   clindamycin (CLEOCIN) capsule 600 mg        600 mg Oral 3 times per day Dec 15, 2011 1546     11/29/11 2230   vancomycin (VANCOCIN) IVPB 1000 mg/200 mL premix  Status:  Discontinued        1,000 mg 200 mL/hr over 60 Minutes Intravenous Every 8 hours 11/29/11 2200 12-15-11 1549   11/29/11 1949   polymyxin B 500,000 Units, bacitracin 50,000 Units in sodium chloride irrigation 0.9 % 500 mL irrigation  Status:  Discontinued          As needed 11/29/11 1950 11/29/11 2006   11/29/11 1300   vancomycin (VANCOCIN) IVPB 1000 mg/200 mL premix        1,000 mg 200 mL/hr over 60 Minutes Intravenous  Once 11/29/11 1247 11/29/11 1505           Patient was given sequential compression devices, early ambulation, and chemoprophylaxis to prevent DVT.  Patient benefited maximally from hospital stay and there were no complications.    Recent vital signs: Patient Vitals for  the past 24 hrs:  BP Temp Temp src Pulse Resp SpO2  12/02/11 0430 114/68 mmHg 97.6 F (36.4 C) Oral 71  16  94 %  12/15/2011 2213 122/63 mmHg 98.5 F (36.9 C) Oral 73  16  94 %  12-15-2011 1425 112/62 mmHg 98.3 F (36.8 C) - 90  16  100 %     Recent laboratory studies:  Basename 12/02/11 0415 2011-12-15 0355 11/30/11 0420 11/29/11 1210  WBC -- -- 13.8* 14.7*  HGB -- -- 14.6 13.8  HCT -- -- 42.2 40.2  PLT -- -- 215 233  NA 137 136 -- --  K 3.9 3.8 -- --  CL 103 101 -- --  CO2 26 25 -- --  BUN 10 10 -- --  CREATININE 0.71 0.64 -- --  GLUCOSE 107* 128* -- --  INR -- -- -- --  CALCIUM 9.1 -- -- --     Discharge Medications:   Medication List  As of 12/02/2011  7:00 AM   TAKE these medications         doxycycline 100 MG tablet   Commonly known as: VIBRA-TABS   Take 1 tablet (100 mg total) by mouth 2 (two) times daily.      GOODY HEADACHE PO   Take 0.5-1 packets by mouth daily as needed. For head ache      ibuprofen 200 MG tablet  Commonly known as: ADVIL,MOTRIN   Take 400 mg by mouth every 8 (eight) hours as needed. For pain.      oxyCODONE 5 MG immediate release tablet   Commonly known as: Oxy IR/ROXICODONE   Take 1 tablet (5 mg total) by mouth every 6 (six) hours as needed for pain.      sulfamethoxazole-trimethoprim 800-160 MG per tablet   Commonly known as: BACTRIM DS,SEPTRA DS   Take 1 tablet by mouth every 12 (twelve) hours.            Diagnostic Studies: No results found.  Disposition: 01-Home or Self Care  Discharge Orders    Future Orders Please Complete By Expires   Diet - low sodium heart healthy      Call MD / Call 911      Comments:   If you experience chest pain or shortness of breath, CALL 911 and be transported to the hospital emergency room.  If you develope a fever above 101 F, pus (white drainage) or increased drainage or redness at the wound, or calf pain, call your surgeon's office.   Constipation Prevention      Comments:   Drink plenty of  fluids.  Prune juice may be helpful.  You may use a stool softener, such as Colace (over the counter) 100 mg twice a day.  Use MiraLax (over the counter) for constipation as needed.   Increase activity slowly as tolerated      Discharge instructions      Comments:   Get your right wrist wound wet daily either letting warm shower water cleanse your wound daily or even soaking your wrist in antibacterial soapy water for 15-20 minutes daily. Then apply dressing with yellow xeroform and dry dressing. Stay out of work thru next week. Call [facility] and Dr. Magnus Ivan for an appointment in 1 weeks.   Discharge patient            Signed: Kathryne Hitch 12/02/2011, 7:00 AM

## 2011-12-02 NOTE — Care Management Note (Signed)
    Page 1 of 2   12/02/2011     3:17:31 PM   CARE MANAGEMENT NOTE 12/02/2011  Patient:  [name],[name]   Account Number:  [id number]  Date Initiated:  11/30/2011  Documentation initiated by:  Colleen Can  Subjective/Objective Assessment:   dx cellulitis and abscess of lt wrist. Insect or spider bite     Action/Plan:   CM spoke with patient. Plans to go back to hotel room where he lives alone Pt is without health insurance but works full time job   Anticipated DC Date:  12/02/2011   Anticipated DC Plan:  HOME/SELF CARE  In-house referral  Financial Counselor      DC Planning Services  CM consult      PAC Choice  NA   Choice offered to / List presented to:  NA   DME arranged  NA      DME agency  NA     HH arranged  NA      Status of service:  Completed, signed off Medicare Important Message given?  NO (If response is "NO", the following Medicare IM given date fields will be blank) Date Medicare IM given:   Date Additional Medicare IM given:    Discharge Disposition:  HOME/SELF CARE  Per UR Regulation:  Reviewed for med. necessity/level of care/duration of stay  If discussed at Long Length of Stay Meetings, dates discussed:    Comments:

## 2011-12-02 NOTE — Progress Notes (Signed)
Patient ID: [name] [name], male   DOB: [birth year], 31 y.o.   MRN: [id number] Doing well.  Can discharge today.  Wound is healing.

## 2011-12-04 LAB — ANAEROBIC CULTURE

## 2011-12-27 LAB — FUNGUS CULTURE W SMEAR: Fungal Smear: NONE SEEN

## 2012-01-11 LAB — AFB CULTURE WITH SMEAR (NOT AT ARMC): Acid Fast Smear: NONE SEEN

## 2012-02-11 ENCOUNTER — Emergency Department (HOSPITAL_COMMUNITY)
Admission: EM | Admit: 2012-02-11 | Discharge: 2012-02-11 | Disposition: A | Discharge status: Home / Self Care | Payer: Self-pay | Attending: Emergency Medicine | Admitting: Emergency Medicine

## 2012-02-11 ENCOUNTER — Encounter (HOSPITAL_COMMUNITY): Payer: Self-pay | Admitting: *Deleted

## 2012-02-11 ENCOUNTER — Emergency Department (HOSPITAL_COMMUNITY): Payer: Self-pay

## 2012-02-11 DIAGNOSIS — S43499A Other sprain of unspecified shoulder joint, initial encounter: Secondary | ICD-10-CM | POA: Insufficient documentation

## 2012-02-11 DIAGNOSIS — S46919A Strain of unspecified muscle, fascia and tendon at shoulder and upper arm level, unspecified arm, initial encounter: Secondary | ICD-10-CM

## 2012-02-11 DIAGNOSIS — Z880 Allergy status to penicillin: Secondary | ICD-10-CM | POA: Insufficient documentation

## 2012-02-11 DIAGNOSIS — I10 Essential (primary) hypertension: Secondary | ICD-10-CM | POA: Insufficient documentation

## 2012-02-11 DIAGNOSIS — F17200 Nicotine dependence, unspecified, uncomplicated: Secondary | ICD-10-CM | POA: Insufficient documentation

## 2012-02-11 DIAGNOSIS — Z884 Allergy status to anesthetic agent status: Secondary | ICD-10-CM | POA: Insufficient documentation

## 2012-02-11 DIAGNOSIS — X500XXA Overexertion from strenuous movement or load, initial encounter: Secondary | ICD-10-CM | POA: Insufficient documentation

## 2012-02-11 MED ORDER — HYDROCODONE-ACETAMINOPHEN 5-325 MG PO TABS: 1.0000 | ORAL_TABLET | Freq: Four times a day (QID) | ORAL | Status: DC | PRN

## 2012-02-11 MED ORDER — ONDANSETRON 8 MG PO TBDP
8.0000 mg | ORAL_TABLET | Freq: Once | ORAL | Status: AC
  Administered 2012-02-11: 8 mg via ORAL
  Filled 2012-02-11: qty 1

## 2012-02-11 MED ORDER — OXYCODONE-ACETAMINOPHEN 5-325 MG PO TABS
1.0000 | ORAL_TABLET | Freq: Once | ORAL | Status: DC
  Filled 2012-02-11: qty 1

## 2012-02-11 MED ORDER — IBUPROFEN 800 MG PO TABS
800.0000 mg | ORAL_TABLET | Freq: Once | ORAL | Status: AC
  Administered 2012-02-11: 800 mg via ORAL
  Filled 2012-02-11: qty 1

## 2012-02-11 MED ORDER — NAPROXEN 500 MG PO TABS: 500.0000 mg | ORAL_TABLET | Freq: Two times a day (BID) | ORAL | Status: DC

## 2012-02-11 NOTE — ED Provider Notes (Signed)
Medical screening examination/treatment/procedure(s) were performed by non-physician practitioner and as supervising physician I was immediately available for consultation/collaboration.  John-Adam Tiffannie Sloss, M.D.     John-Adam Antinette Keough, MD 02/11/12 1817 

## 2012-02-11 NOTE — ED Notes (Signed)
Pt arrives ambulatory to [facility] from home. sts that he was trying to move a couch approximately 2 hours ago and that the couch slipped out of his hand and he injured his right shoulder trying to catch it. Patient sts he took 1 goodys powder to try to alleviate the pain with no relief. sts that he has had previous problems with this shoulder and is currently seeing a physician for cortisone shots to same shoulder.

## 2012-02-11 NOTE — ED Provider Notes (Signed)
History     CSN: [id number]  Arrival date & time 02/11/12  1638   First MD Initiated Contact with Patient 02/11/12 1642      Chief Complaint  Patient presents with  . Shoulder Pain    (Consider location/radiation/quality/duration/timing/severity/associated sxs/prior treatment) HPI Comments: 31 year old male with a history of right shoulder rotator cuff issues presents emergency department complaining of right shoulder pain.  He states that he was moving a large couch when it slipped out of his hands.  Patient states that he attempted to catch it quickly causing strain to his right shoulder.  Since the incident patient complains of pain with range of motion.  Symptoms severity is mild to moderate, exacerbating factors include any motion, patient attempted to take Goody's powder without relief.  Patient denies any numbness tingling or decreased sensation of extremity.  No other complaints at this time.  Patient is a 31 y.o. male presenting with shoulder pain. The history is provided by the patient.  Shoulder Pain Associated symptoms include arthralgias. Pertinent negatives include no congestion, coughing, diaphoresis, fever, headaches, myalgias or neck pain.    Past Medical History  Diagnosis Date  . Hypertension     Past Surgical History  Procedure Date  . I&d extremity 11/29/2011    Procedure: IRRIGATION AND DEBRIDEMENT EXTREMITY;  Surgeon: Kathryne Hitch, MD;  Location: [facility];  Service: Orthopedics;  Laterality: Right;  right wrist abscess  . Wrist surgery     History reviewed. No pertinent family history.  History  Substance Use Topics  . Smoking status: Current Every Day Smoker  . Smokeless tobacco: Not on file  . Alcohol Use: No      Review of Systems  Constitutional: Negative for fever, diaphoresis and activity change.  HENT: Negative for congestion and neck pain.   Respiratory: Negative for cough.   Genitourinary: Negative for dysuria.    Musculoskeletal: Positive for arthralgias. Negative for myalgias.  Skin: Negative for color change and wound.  Neurological: Negative for headaches.  All other systems reviewed and are negative.    Allergies  Penicillins and Tylenol  Home Medications   Current Outpatient Rx  Name Route Sig Dispense Refill  . GOODY HEADACHE PO Oral Take 0.5-1 packets by mouth daily as needed. For head ache    . IBUPROFEN 200 MG PO TABS Oral Take 400 mg by mouth every 8 (eight) hours as needed. For pain.      BP 136/84  Pulse 88  Temp 98.6 F (37 C) (Oral)  Resp 16  Ht 5\' 11"  (1.803 m)  Wt 185 lb (83.915 kg)  BMI 25.80 kg/m2  SpO2 100%  Physical Exam  Nursing note and vitals reviewed. Constitutional: He appears well-developed and well-nourished. No distress.  HENT:  Head: Normocephalic and atraumatic.  Eyes: Conjunctivae normal and EOM are normal.  Neck: Normal range of motion. Neck supple.  Cardiovascular:       Intact distal pulses, capillary refill < 3 seconds  Musculoskeletal:       Tenderness to palpation over right rotator cuff muscles.  No labrum tenderness.  Crepitus felt with range of motion.  Pain induced with both active and passive range of motion. All other extremities with normal ROM  Neurological:       No sensory deficit  Skin: He is not diaphoretic.       Skin intact, no tenting    ED Course  Procedures (including critical care time)  Labs Reviewed - No data to display Dg  Shoulder Right  02/11/2012  *RADIOLOGY REPORT*  Clinical Data: Right shoulder pain.  Lifting injury.  History of torn rotator cuff.  RIGHT SHOULDER - 2+ VIEW  Comparison: None.  Findings: No fracture or dislocation.  No abnormal soft tissue calcifications.  Minimal apical pleural thickening without associated bony destruction.  Visualized lungs otherwise clear.  IMPRESSION: No acute abnormality.  Please see above.   Original Report Authenticated By: Fuller Canada, M.D.      No diagnosis  found.    MDM  Patient X-Ray negative for obvious fracture or dislocation. Pain managed in ED. Pt advised to follow up with his orthopedic if conservative home therapies to not improve symptoms to assess for possible rotator cuff tear.  Patient will be dc home & is agreeable with above plan.   Jaci Carrel, [place] 02/11/12 1800

## 2012-02-11 NOTE — ED Notes (Signed)
Patient given discharge instructions, information, prescriptions, and diet order. Patient states that they adequately understand discharge information given and to return to ED if symptoms return or worsen.     

## 2012-09-06 ENCOUNTER — Emergency Department (HOSPITAL_COMMUNITY)
Admission: EM | Admit: 2012-09-06 | Discharge: 2012-09-06 | Disposition: A | Discharge status: Home / Self Care | Payer: Self-pay | Attending: Emergency Medicine | Admitting: Emergency Medicine

## 2012-09-06 ENCOUNTER — Encounter (HOSPITAL_COMMUNITY): Payer: Self-pay | Admitting: *Deleted

## 2012-09-06 DIAGNOSIS — F17200 Nicotine dependence, unspecified, uncomplicated: Secondary | ICD-10-CM | POA: Insufficient documentation

## 2012-09-06 DIAGNOSIS — N509 Disorder of male genital organs, unspecified: Secondary | ICD-10-CM | POA: Insufficient documentation

## 2012-09-06 DIAGNOSIS — I10 Essential (primary) hypertension: Secondary | ICD-10-CM | POA: Insufficient documentation

## 2012-09-06 DIAGNOSIS — Z880 Allergy status to penicillin: Secondary | ICD-10-CM | POA: Insufficient documentation

## 2012-09-06 DIAGNOSIS — N342 Other urethritis: Secondary | ICD-10-CM | POA: Insufficient documentation

## 2012-09-06 LAB — URINALYSIS, ROUTINE W REFLEX MICROSCOPIC
Glucose, UA: NEGATIVE mg/dL
Nitrite: NEGATIVE
Specific Gravity, Urine: 1.024 (ref 1.005–1.030)
pH: 7.5 (ref 5.0–8.0)

## 2012-09-06 LAB — URINE MICROSCOPIC-ADD ON

## 2012-09-06 MED ORDER — AZITHROMYCIN 250 MG PO TABS
1000.0000 mg | ORAL_TABLET | Freq: Once | ORAL | Status: AC
  Administered 2012-09-06: 1000 mg via ORAL
  Filled 2012-09-06: qty 4

## 2012-09-06 MED ORDER — CEFTRIAXONE SODIUM 250 MG IJ SOLR
250.0000 mg | Freq: Once | INTRAMUSCULAR | Status: AC
  Administered 2012-09-06: 250 mg via INTRAMUSCULAR
  Filled 2012-09-06: qty 250

## 2012-09-06 MED ORDER — LIDOCAINE HCL 1 % IJ SOLN
INTRAMUSCULAR | Status: AC
  Administered 2012-09-06: 23:00:00
  Filled 2012-09-06: qty 20

## 2012-09-06 NOTE — ED Provider Notes (Signed)
History    This chart was scribed for [name] Crumble, PA working with [name] Razor, MD by ED Scribe, Burman Nieves. This patient was seen in room WTR8/WTR8 and the patient's care was started at 8:23 PM.   CSN: [id number]  Arrival date & time 09/06/12  1847   First MD Initiated Contact with Patient 09/06/12 1923      Chief Complaint  Patient presents with  . Penile Discharge    (Consider location/radiation/quality/duration/timing/severity/associated sxs/prior treatment) Patient is a 32 y.o. male presenting with penile discharge. The history is provided by the patient. No language interpreter was used.  Penile Discharge This is a new problem. The current episode started 2 days ago. The problem has not changed since onset.  HPI Comments: [name] is a 32 y.o. male who presents to the Emergency Department complaining of moderate constant penile pain and penile discharge that has been going on for 2 days now. He states that it is a yellow puss like discharge and reports pain to the head of his penis. Pt denies fever, chills, cough, nausea, vomiting, diarrhea, SOB, weakness, and any other associated symptoms. Pt denies any sexual activity in the past month. Pt is allergic to penicillins and tylenol.      Past Medical History  Diagnosis Date  . Hypertension     Past Surgical History  Procedure Laterality Date  . I&d extremity  11/29/2011    Procedure: IRRIGATION AND DEBRIDEMENT EXTREMITY;  Surgeon: Kathryne Hitch, MD;  Location: [facility];  Service: Orthopedics;  Laterality: Right;  right wrist abscess  . Wrist surgery      History reviewed. No pertinent family history.  History  Substance Use Topics  . Smoking status: Current Every Day Smoker    Types: Cigarettes  . Smokeless tobacco: Not on file  . Alcohol Use: No      Review of Systems  Genitourinary: Positive for discharge and penile pain.  All other systems reviewed and are negative.    Allergies   Penicillins and Tylenol  Home Medications  No current outpatient prescriptions on file.  BP 121/68  Pulse 85  Temp(Src) 97.8 F (36.6 C) (Oral)  Resp 20  SpO2 98%  Physical Exam  Nursing note and vitals reviewed. Constitutional: He is oriented to person, place, and time. He appears well-developed and well-nourished. No distress.  HENT:  Head: Normocephalic and atraumatic.  Eyes: EOM are normal.  Neck: Neck supple. No tracheal deviation present.  Cardiovascular: Normal rate, regular rhythm and normal heart sounds.   Pulmonary/Chest: Effort normal. No respiratory distress.  Abdominal: Soft. Bowel sounds are normal. He exhibits no distension. There is no tenderness. There is no rebound.  No CVA tenderness  Genitourinary: Testes normal.  Normal appearing penis with yellow discharge of the penis at the meatus  Musculoskeletal: Normal range of motion.  Neurological: He is alert and oriented to person, place, and time.  Skin: Skin is warm and dry.  Psychiatric: He has a normal mood and affect. His behavior is normal.    ED Course  Procedures (including critical care time) DIAGNOSTIC STUDIES: Oxygen Saturation is 98% on room air, normal by my interpretation.    COORDINATION OF CARE:  8:40 PM Discussed ED treatment with pt and pt agrees.    Results for orders placed during the hospital encounter of 09/06/12  URINALYSIS, ROUTINE W REFLEX MICROSCOPIC      Result Value Range   Color, Urine YELLOW  YELLOW   APPearance TURBID (*) CLEAR  Specific Gravity, Urine 1.024  1.005 - 1.030   pH 7.5  5.0 - 8.0   Glucose, UA NEGATIVE  NEGATIVE mg/dL   Hgb urine dipstick NEGATIVE  NEGATIVE   Bilirubin Urine SMALL (*) NEGATIVE   Ketones, ur NEGATIVE  NEGATIVE mg/dL   Protein, ur NEGATIVE  NEGATIVE mg/dL   Urobilinogen, UA 1.0  0.0 - 1.0 mg/dL   Nitrite NEGATIVE  NEGATIVE   Leukocytes, UA MODERATE (*) NEGATIVE  URINE MICROSCOPIC-ADD ON      Result Value Range   Squamous Epithelial /  LPF FEW (*) RARE   WBC, UA 3-6  <3 WBC/hpf   Bacteria, UA RARE  RARE   Urine-Other AMORPHOUS URATES/PHOSPHATES     No results found.    1. Urethritis       MDM  Pt with yellow penile discharge, dysuria. Denies sexual intercourse for the last month. UA moderate leukocytes, however no bacteria and no elevated WBCs. Pt does have yellow penile discharge on exam. i have covered him with rocephin 250mg  IM and zithromax 1g PO for possible STI given his exam findings. Sent urine cultures. Pt is to follow up outpatient.    Filed Vitals:   09/06/12 1903  BP: 121/68  Pulse: 85  Temp: 97.8 F (36.6 C)  TempSrc: Oral  Resp: 20  SpO2: 98%    I personally performed the services described in this documentation, which was scribed in my presence. The recorded information has been reviewed and is accurate.    Lottie Mussel, PA-C 09/07/12 [phone]

## 2012-09-06 NOTE — ED Notes (Signed)
Pt c/o penile discharge x's 2 days ago. Denies exposure to STD. Reports pus-type drainage and pain to head of penis.

## 2012-09-07 LAB — GC/CHLAMYDIA PROBE AMP
CT Probe RNA: POSITIVE — AB
GC Probe RNA: POSITIVE — AB

## 2012-09-08 ENCOUNTER — Telehealth (HOSPITAL_COMMUNITY): Payer: Self-pay | Admitting: Emergency Medicine

## 2012-09-08 NOTE — ED Notes (Signed)
+  Gonorrhea. +Chlamydia. Patient treated with Rocephin and Zithromax. [employer] faxed. 

## 2012-09-08 NOTE — ED Notes (Signed)
Patient has +Gonorrhea and +Chlamydia.

## 2012-09-09 NOTE — ED Provider Notes (Signed)
Medical screening examination/treatment/procedure(s) were performed by non-physician practitioner and as supervising physician I was immediately available for consultation/collaboration.  Daissy Yerian, MD 09/09/12 1602 

## 2013-12-11 ENCOUNTER — Emergency Department (HOSPITAL_COMMUNITY): Payer: 59

## 2013-12-11 ENCOUNTER — Emergency Department (HOSPITAL_COMMUNITY)
Admission: EM | Admit: 2013-12-11 | Discharge: 2013-12-11 | Disposition: A | Discharge status: Home / Self Care | Payer: 59 | Attending: Emergency Medicine | Admitting: Emergency Medicine

## 2013-12-11 DIAGNOSIS — I10 Essential (primary) hypertension: Secondary | ICD-10-CM | POA: Diagnosis not present

## 2013-12-11 DIAGNOSIS — Y9389 Activity, other specified: Secondary | ICD-10-CM | POA: Insufficient documentation

## 2013-12-11 DIAGNOSIS — F17200 Nicotine dependence, unspecified, uncomplicated: Secondary | ICD-10-CM | POA: Insufficient documentation

## 2013-12-11 DIAGNOSIS — Z880 Allergy status to penicillin: Secondary | ICD-10-CM | POA: Diagnosis not present

## 2013-12-11 DIAGNOSIS — W268XXA Contact with other sharp object(s), not elsewhere classified, initial encounter: Secondary | ICD-10-CM | POA: Insufficient documentation

## 2013-12-11 DIAGNOSIS — S51809A Unspecified open wound of unspecified forearm, initial encounter: Secondary | ICD-10-CM | POA: Diagnosis not present

## 2013-12-11 DIAGNOSIS — S51812A Laceration without foreign body of left forearm, initial encounter: Secondary | ICD-10-CM

## 2013-12-11 DIAGNOSIS — Y929 Unspecified place or not applicable: Secondary | ICD-10-CM | POA: Insufficient documentation

## 2013-12-11 MED ORDER — CEPHALEXIN 500 MG PO CAPS: 500.0000 mg | ORAL_CAPSULE | Freq: Three times a day (TID) | ORAL | Status: AC

## 2013-12-11 MED ORDER — LIDOCAINE-EPINEPHRINE (PF) 2 %-1:200000 IJ SOLN
INTRAMUSCULAR | Status: AC
  Administered 2013-12-11: 08:00:00
  Filled 2013-12-11: qty 10
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
## 2013-12-11 NOTE — ED Notes (Signed)
MD at bedside. 

## 2013-12-11 NOTE — Discharge Instructions (Signed)

## 2013-12-11 NOTE — ED Provider Notes (Signed)
CSN: [id number]     Arrival date & time 12/11/13  9326 History   First MD Initiated Contact with Patient 12/11/13 [phone]     Chief Complaint  Patient presents with  . Extremity Laceration      HPI Patient presents to the emergency department because of a laceration to his left anterior forearm.  He states he had a box on his right knee and was opening a box with a box cutter when he accidentally struck his left forearm with a blade.  He states there was rather significant bleeding initially.  He feels as though it sprayed up.  He reports some tingling around his thumb but denies pain.  He can flex his wrist and he can oppose his thumb and little finger.  He has no weakness of his hand.  Symptoms are mild in severity.  Bleeding controlled by pressure by EMS.   Past Medical History  Diagnosis Date  . Hypertension    Past Surgical History  Procedure Laterality Date  . I&d extremity  11/29/2011    Procedure: IRRIGATION AND DEBRIDEMENT EXTREMITY;  Surgeon: Mcarthur Rossetti, MD;  Location: [facility];  Service: Orthopedics;  Laterality: Right;  right wrist abscess  . Wrist surgery     No family history on file. History  Substance Use Topics  . Smoking status: Current Every Day Smoker    Types: Cigarettes  . Smokeless tobacco: Not on file  . Alcohol Use: No    Review of Systems  All other systems reviewed and are negative.     Allergies  Penicillins and Tylenol  Home Medications   Prior to Admission medications   Not on File   BP 106/84  Pulse 93  Temp(Src) 98.1 F (36.7 C) (Oral)  Resp 18  SpO2 98% Physical Exam  Nursing note and vitals reviewed. Constitutional: He is oriented to person, place, and time. He appears well-developed and well-nourished.  HENT:  Head: Normocephalic.  Eyes: EOM are normal.  Neck: Normal range of motion.  Pulmonary/Chest: Effort normal.  Abdominal: He exhibits no distension.  Musculoskeletal: Normal range of motion.  1.5 cm laceration  of his left distal third forearm on the anterior radial aspect.  Normal palpable left radial pulse and strong dopplerable triphasic pulse noted in the distal left radial artery.  Normal perfusion to his hand.  Normal flexion and extension at his left wrist.  Able to oppose his thumb and little finger.  Some changes in sensation in the radial nerve distribution.  No active bleeding or laceration.  Wound probed and bottom of the wound met. Unable to visualize the bottom of the wound.   Neurological: He is alert and oriented to person, place, and time.  Psychiatric: He has a normal mood and affect.    ED Course  Procedures (including critical care time)  LACERATION REPAIR Performed by: Hoy Morn Consent: Verbal consent obtained. Risks and benefits: risks, benefits and alternatives were discussed Patient identity confirmed: provided demographic data Time out performed prior to procedure Prepped and Draped in normal sterile fashion Wound explored Laceration Location: left anterior forearm Laceration Length: 1.5cm No Foreign Bodies seen or palpated Anesthesia: local infiltration Local anesthetic: lidocaine 2% with epinephrine Anesthetic total: 5 ml Irrigation method: syringe Amount of cleaning: standard Skin closure: 3-0 prolene Number of sutures or staples: 2 Technique: simple interrupted Patient tolerance: Patient tolerated the procedure well with no immediate complications.   Labs Review Labs Reviewed - No data to display  Imaging Review Dg  Forearm Left  12/11/2013   CLINICAL DATA:  Laceration left forearm.  Question foreign body.  EXAM: LEFT FOREARM - 2 VIEW  COMPARISON:  None.  FINDINGS: No fracture, dislocation or radiopaque foreign body is identified.  IMPRESSION: Negative for foreign body.  Negative exam.   Electronically Signed   By: Inge Rise M.D.   On: 12/11/2013 08:19  I personally reviewed the imaging tests through PACS system I reviewed available  ER/hospitalization records through the EMR    EKG Interpretation None      MDM   Final diagnoses:  Laceration of left forearm, initial encounter    Laceration repaired.  Low suspicion for radial artery injury.  I explored the wound at length and it did not seem very deep.  He continues to have some radial nerve distribution sensation issues without significant weakness.  I have asked the patient to followup with his orthopedic surgeon Dr. Ninfa Linden who he has seen before later this week for reevaluation of his laceration and his radial nerve function.  Tetanus updated.  Wound cleaned.  Patient be placed on a short course of antibiotics        Hoy Morn, MD 12/11/13 [phone]

## 2014-01-22 IMAGING — CR DG RIBS W/ CHEST 3+V*R*
3 series · 3 of 3 positions shown · non-contrast
Comparison: None.

CLINICAL DATA: Right upper anterior rib pain.  Injury.

RIGHT RIBS AND CHEST - 3+ VIEW

[w chest pa]
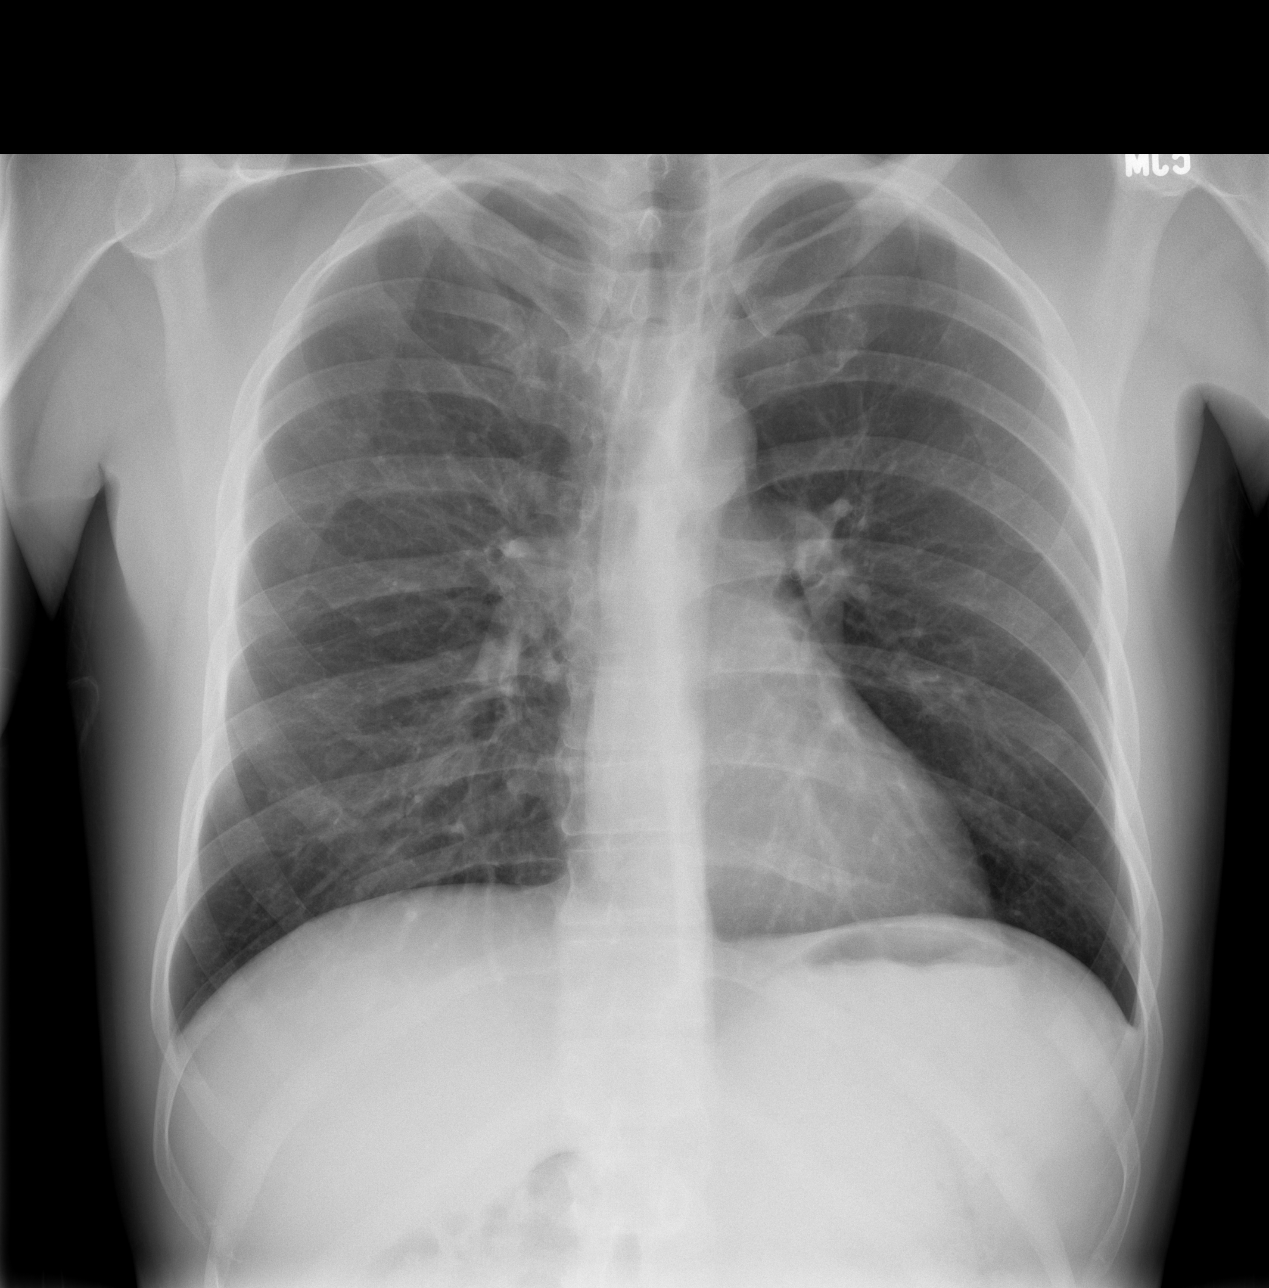

[w ribs ap/pa upper right]
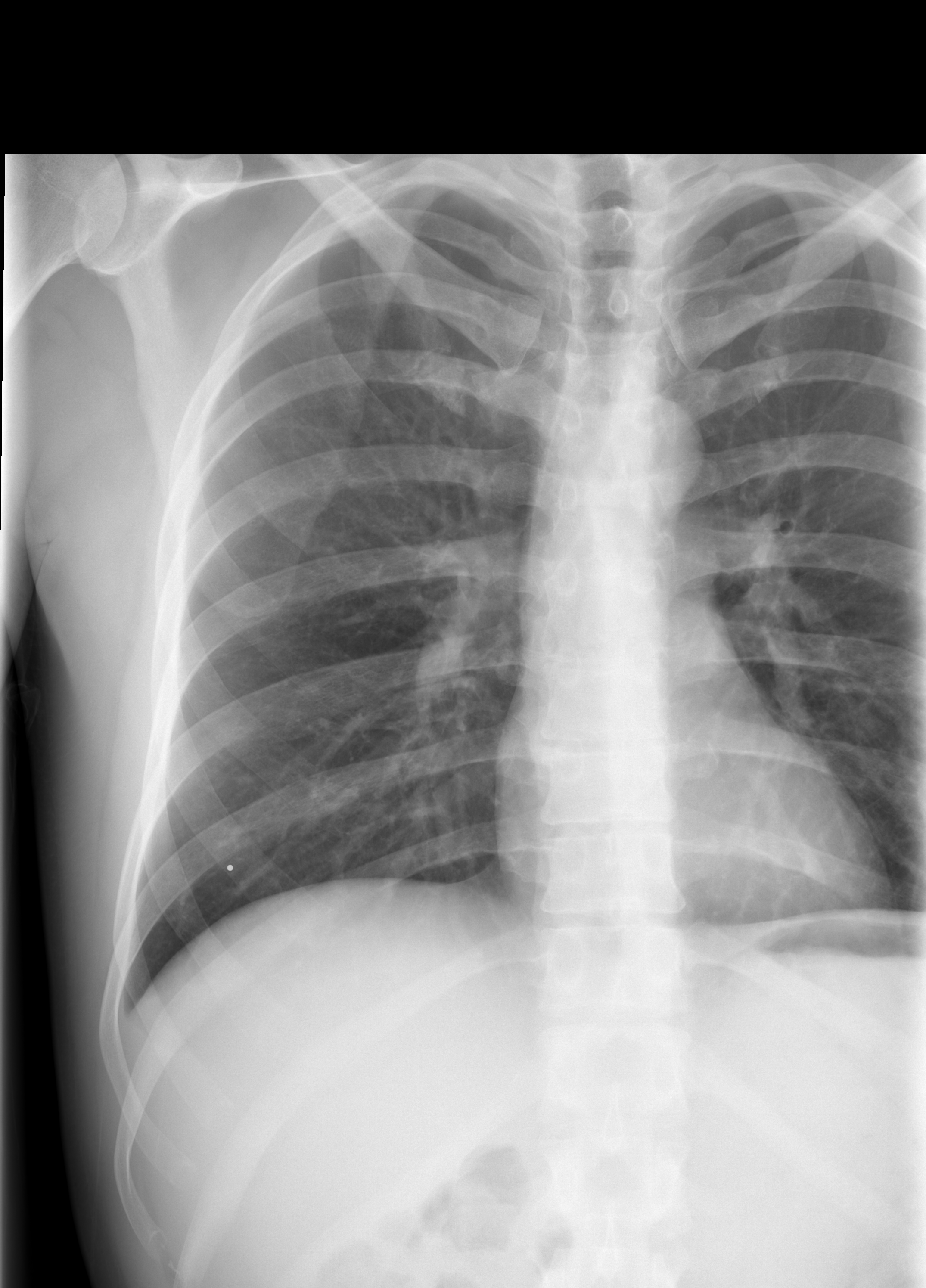

[w ribs oblique right]
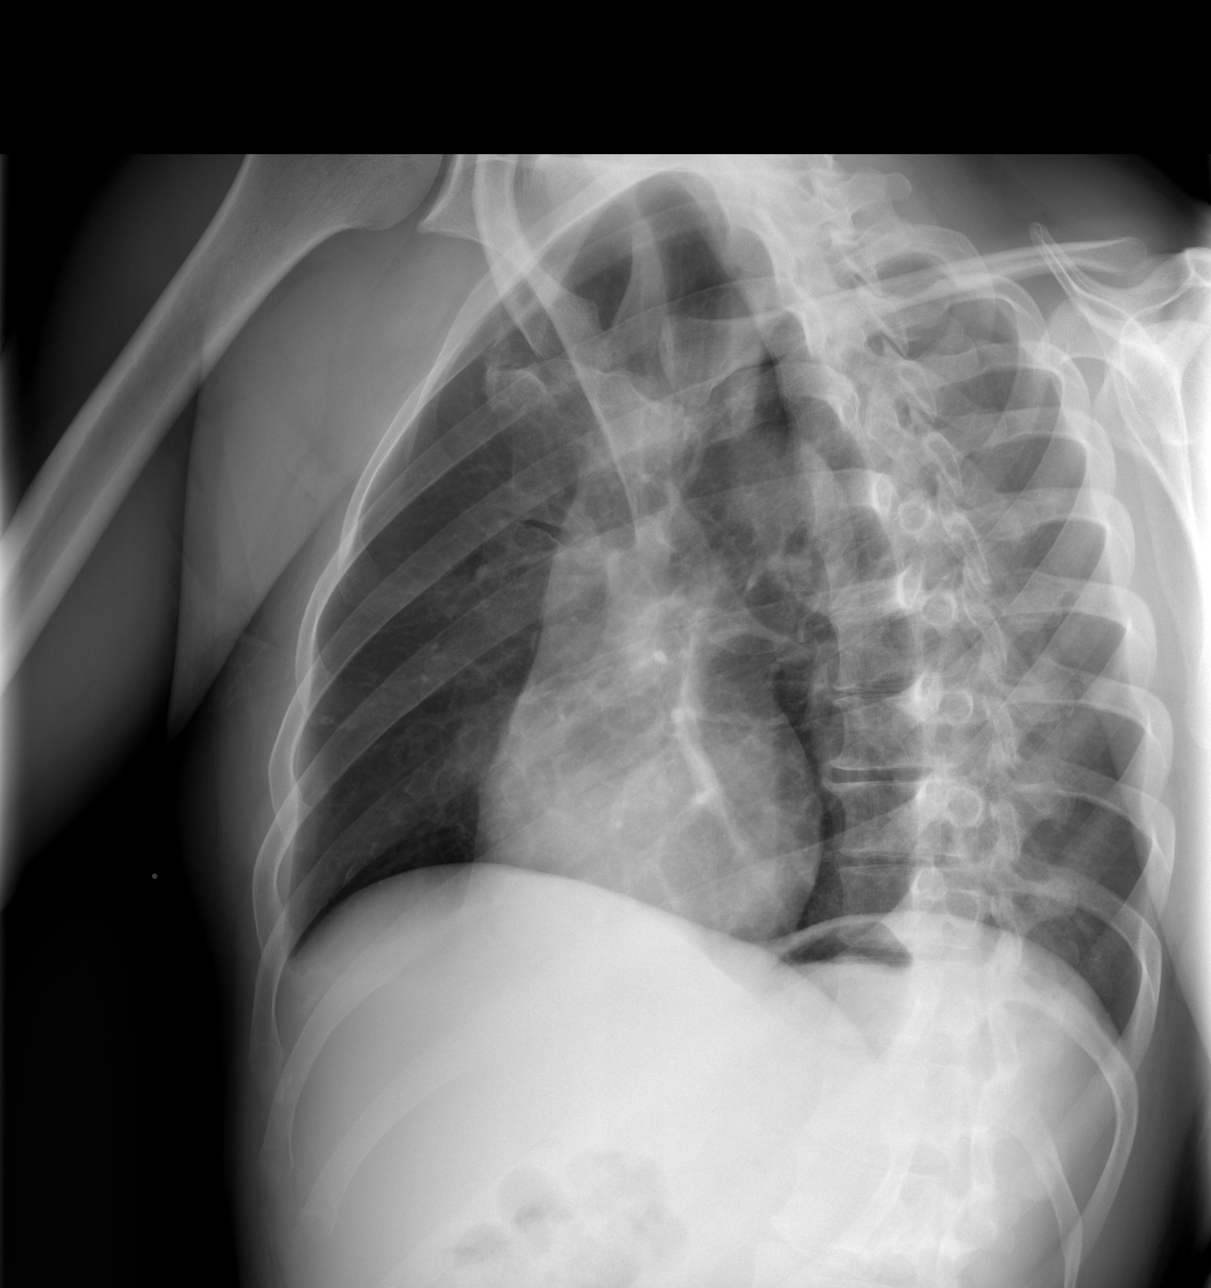

[3 of 3 positions shown; findings below may reference images not displayed]

FINDINGS: Heart and mediastinal contours are within normal limits.
No focal opacities or effusions.  No acute bony abnormality.  No
rib fracture or pneumothorax.
IMPRESSION: No active cardiopulmonary disease.

## 2014-02-07 ENCOUNTER — Emergency Department (HOSPITAL_COMMUNITY): Payer: 59

## 2014-02-07 ENCOUNTER — Emergency Department (HOSPITAL_COMMUNITY)
Admission: EM | Admit: 2014-02-07 | Discharge: 2014-02-07 | Disposition: A | Discharge status: Home / Self Care | Payer: 59 | Attending: Emergency Medicine | Admitting: Emergency Medicine

## 2014-02-07 ENCOUNTER — Encounter (HOSPITAL_COMMUNITY): Payer: Self-pay | Admitting: Emergency Medicine

## 2014-02-07 DIAGNOSIS — Z792 Long term (current) use of antibiotics: Secondary | ICD-10-CM | POA: Insufficient documentation

## 2014-02-07 DIAGNOSIS — Z880 Allergy status to penicillin: Secondary | ICD-10-CM | POA: Insufficient documentation

## 2014-02-07 DIAGNOSIS — Y9389 Activity, other specified: Secondary | ICD-10-CM | POA: Insufficient documentation

## 2014-02-07 DIAGNOSIS — S199XXA Unspecified injury of neck, initial encounter: Secondary | ICD-10-CM | POA: Insufficient documentation

## 2014-02-07 DIAGNOSIS — M542 Cervicalgia: Secondary | ICD-10-CM

## 2014-02-07 DIAGNOSIS — Y92410 Unspecified street and highway as the place of occurrence of the external cause: Secondary | ICD-10-CM | POA: Insufficient documentation

## 2014-02-07 DIAGNOSIS — Z720 Tobacco use: Secondary | ICD-10-CM | POA: Insufficient documentation

## 2014-02-07 DIAGNOSIS — I10 Essential (primary) hypertension: Secondary | ICD-10-CM | POA: Insufficient documentation

## 2014-02-07 DIAGNOSIS — S4991XA Unspecified injury of right shoulder and upper arm, initial encounter: Secondary | ICD-10-CM | POA: Insufficient documentation

## 2014-02-07 DIAGNOSIS — M25511 Pain in right shoulder: Secondary | ICD-10-CM

## 2014-02-07 MED ORDER — IBUPROFEN 800 MG PO TABS
800.0000 mg | ORAL_TABLET | Freq: Once | ORAL | Status: AC
  Administered 2014-02-07: 800 mg via ORAL
  Filled 2014-02-07: qty 1

## 2014-02-07 MED ORDER — MELOXICAM 7.5 MG PO TABS: 7.5000 mg | ORAL_TABLET | Freq: Every day | ORAL | Status: AC
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
## 2014-02-07 NOTE — ED Notes (Signed)
Pt requesting to be re-evaluted for pain management

## 2014-02-07 NOTE — Discharge Instructions (Signed)
Acromioclavicular Injuries °The acromioclavicular (AC) joint is the joint in the shoulder. There are many bands of tissue (ligaments) that surround the AC bones and joints. These bands of tissue can tear, which can lead to sprains and separations. The bones of the AC joint can also break (fracture).  °HOME CARE  °· Put ice on the injured area. °¨ Put ice in a plastic bag. °¨ Place a towel between your skin and the bag. °¨ Leave the ice on for 15-20 minutes, 03-04 times a day. °· Wear your sling as told by your doctor. Remove the sling before showering and bathing. Keep the shoulder in the same place as when the sling is on. Do not lift the arm. °· Gently tighten your figure-eight splint (if applied) every day. Tighten it enough to keep the shoulders held back. There should be room to place your finger between your body and the strap. Loosen the splint right away if you lose feeling (numbness) or have tingling in your hands. °· Only take medicine as told by your doctor. °· Keep all follow-up visits with your doctor. °GET HELP RIGHT AWAY IF:  °· Your medicine does not help your pain. °· You have more puffiness (swelling) or your bruising gets worse rather than better. °· You were unable to follow up as told by your doctor. °· You have tingling or lose even more feeling in your arm, forearm, or hand. °· Your arm is cold or pale. °· You have more pain in the hand, forearm, or fingers. °MAKE SURE YOU:  °· Understand these instructions. °· Will watch your condition. °· Will get help right away if you are not doing well or get worse. °Document Released: 10/07/2009 Document Revised: 07/12/2011 Document Reviewed: 10/07/2009 °[employer]® Patient Information ©2015 [employer]. This information is not intended to replace advice given to you by your health care provider. Make sure you discuss any questions you have with your health care provider. ° °
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
## 2014-02-08 NOTE — ED Provider Notes (Signed)
Medical screening examination/treatment/procedure(s) were performed by non-physician practitioner and as supervising physician I was immediately available for consultation/collaboration.   EKG Interpretation None        Everline Mahaffy, MD 02/08/14 1247 

## 2014-05-29 IMAGING — CR DG SHOULDER 2+V*R*
3 series · 3 of 3 positions shown · non-contrast
Comparison: None.

CLINICAL DATA: Right shoulder pain.  Lifting injury.  History of
torn rotator cuff.

RIGHT SHOULDER - 2+ VIEW

[w shoulder external right]
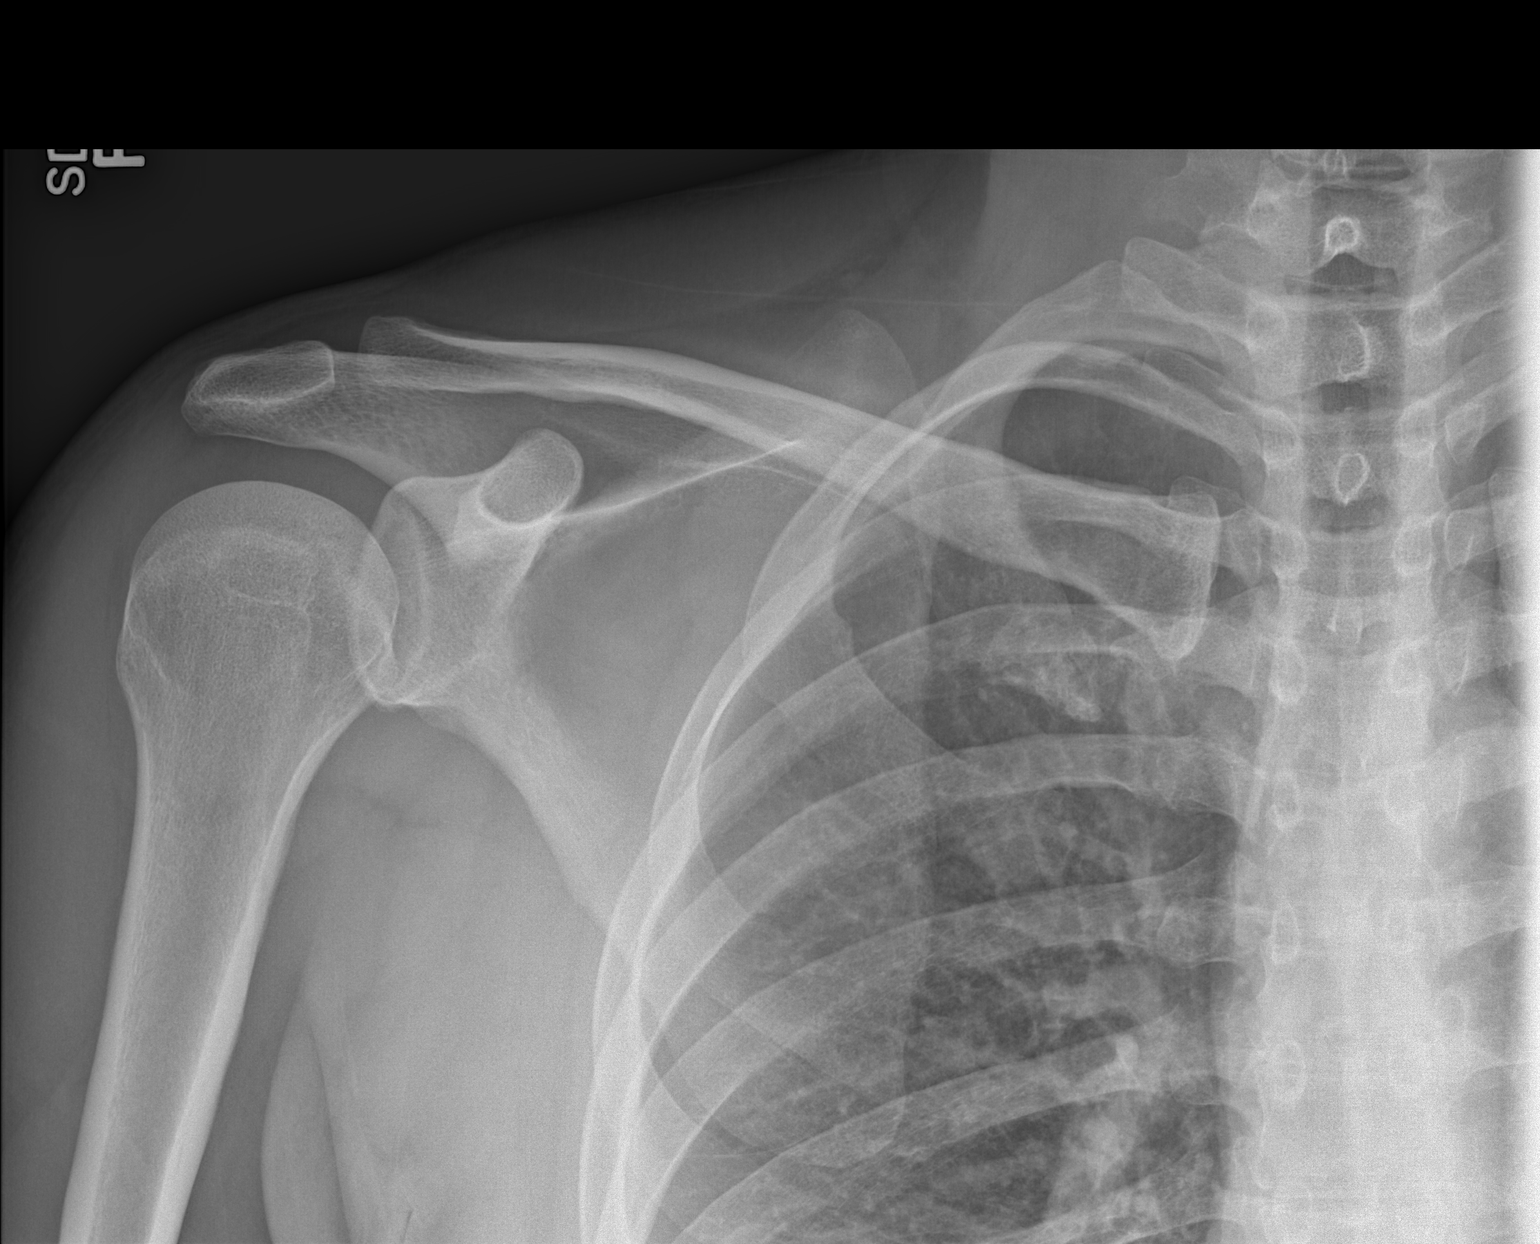

[w shoulder internal right]
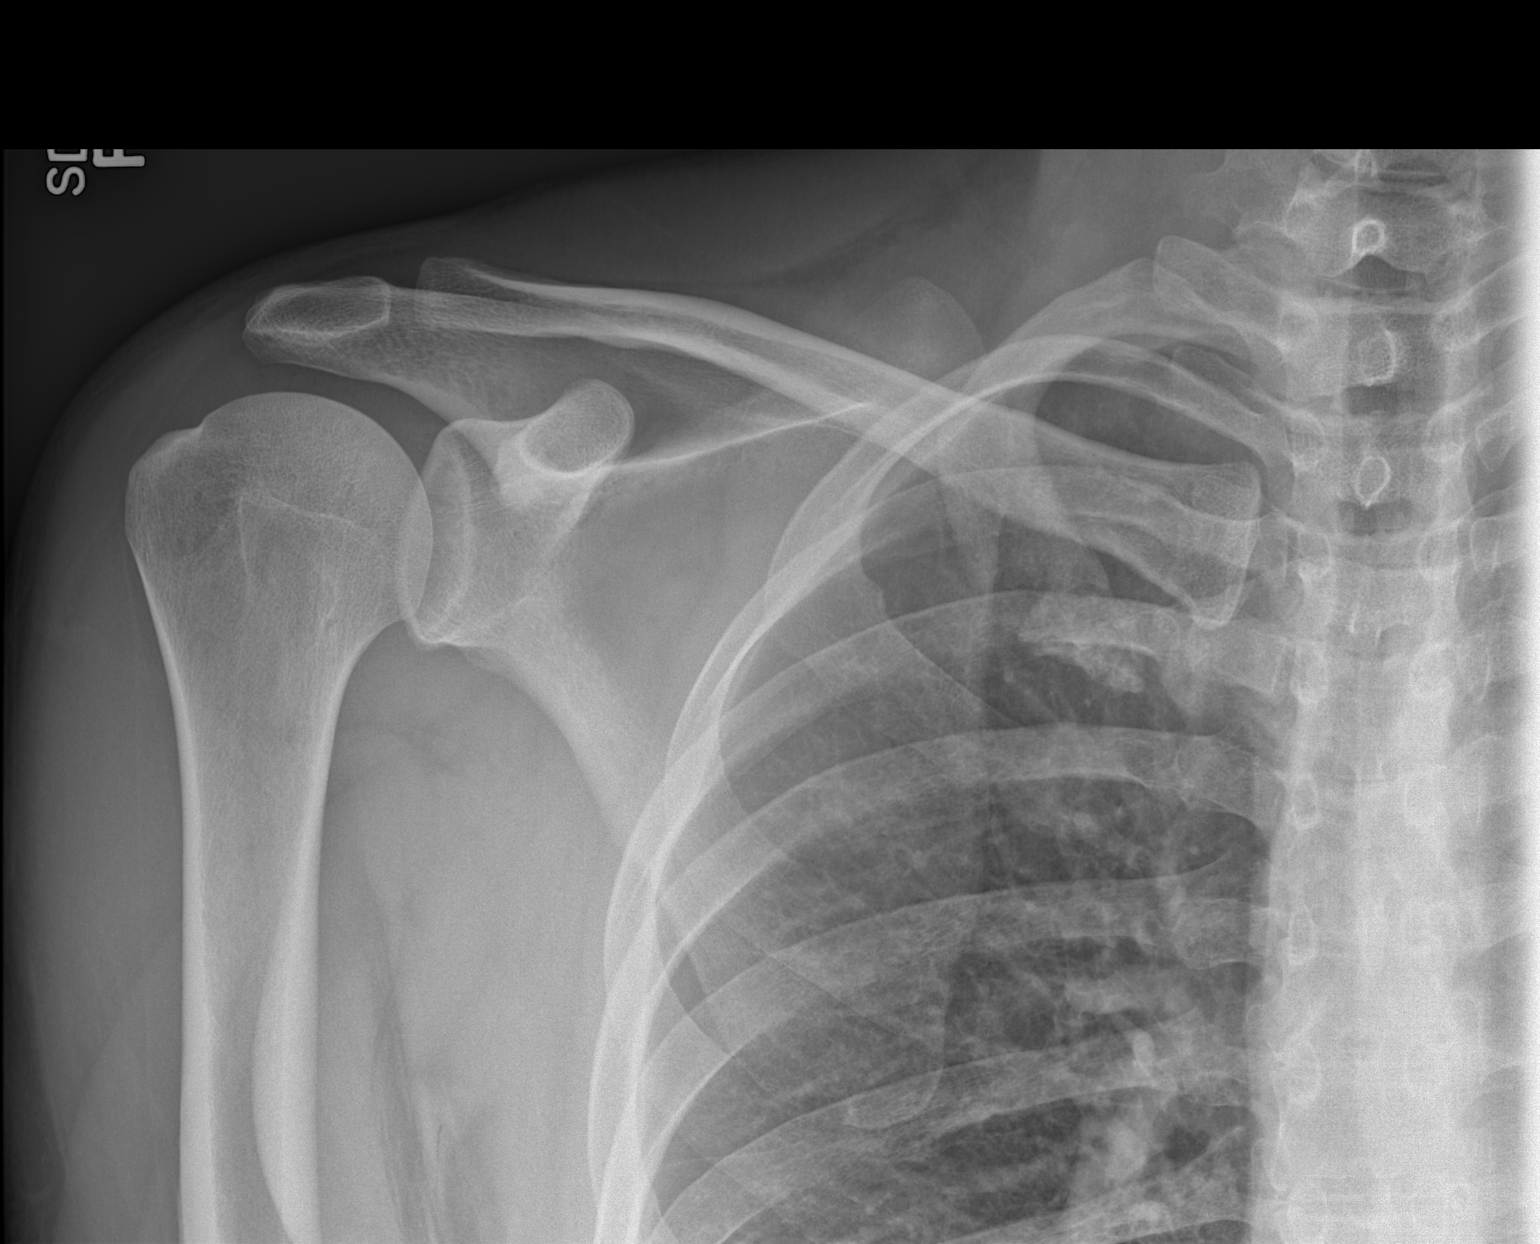

[w shoulder y-view right]
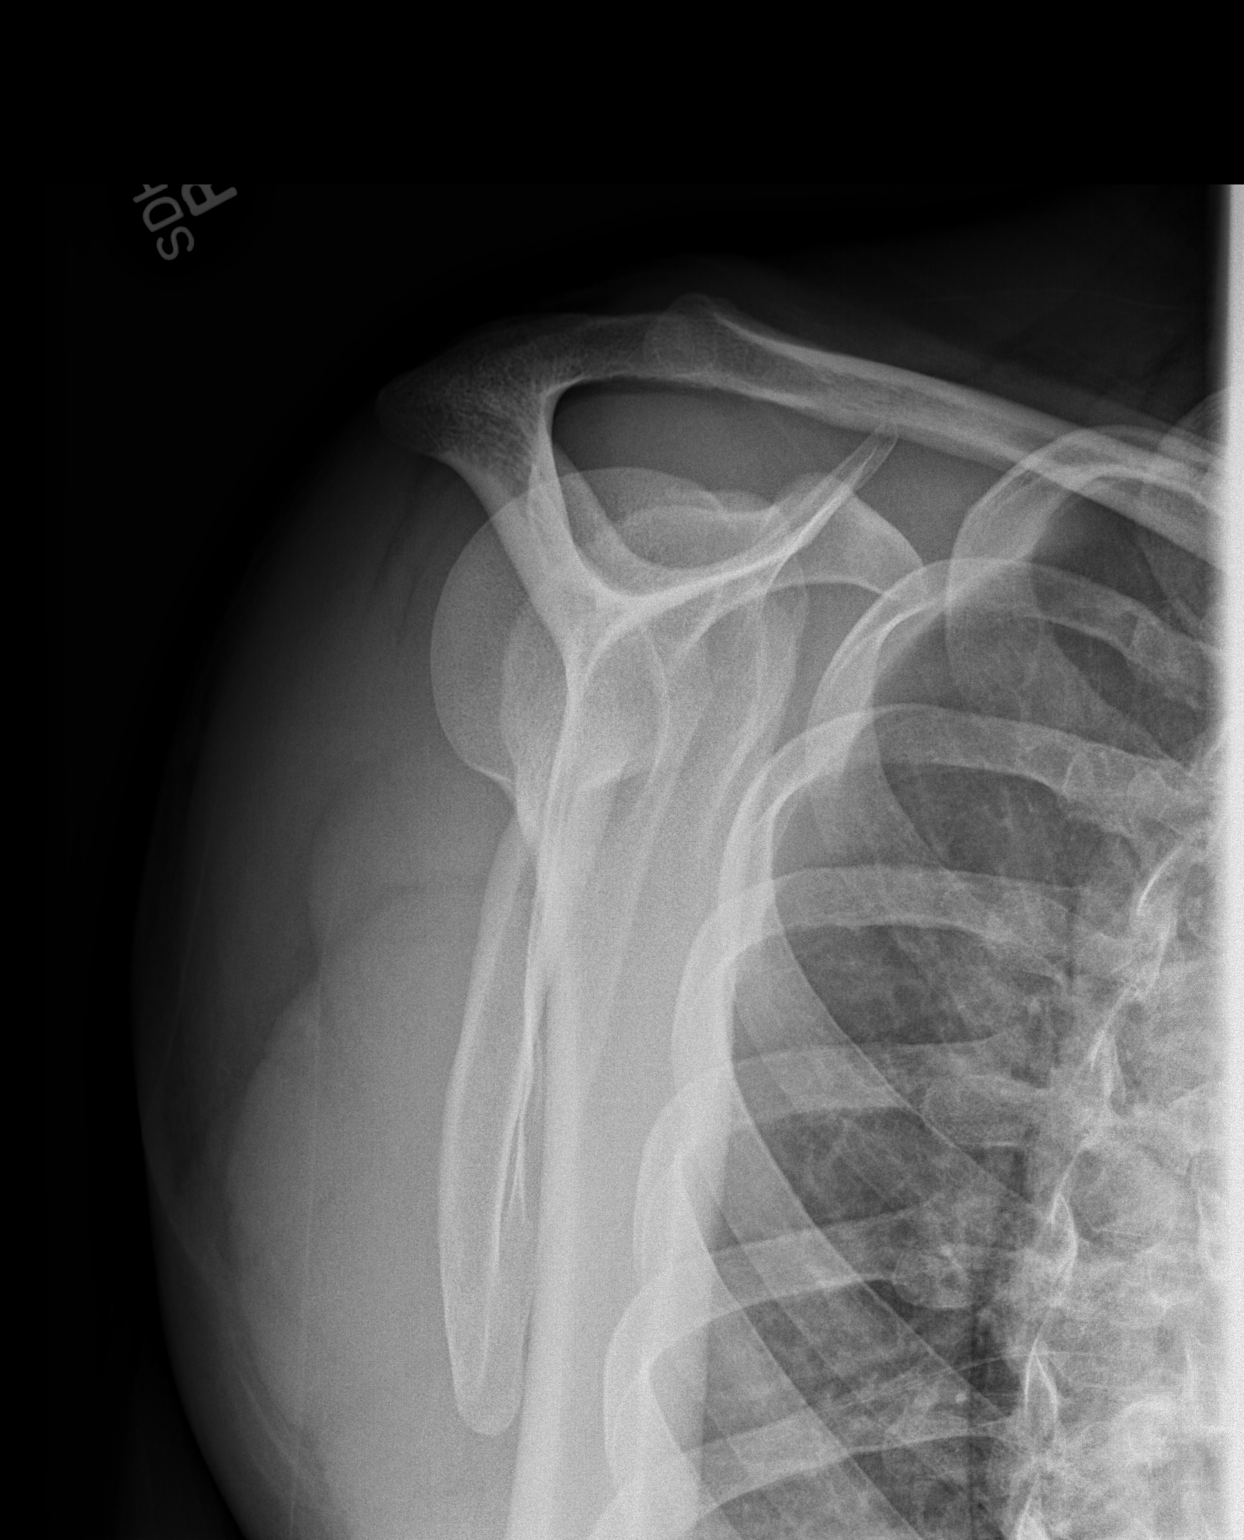

[3 of 3 positions shown; findings below may reference images not displayed]

FINDINGS: No fracture or dislocation.

No abnormal soft tissue calcifications.

Minimal apical pleural thickening without associated bony
destruction.  Visualized lungs otherwise clear.
IMPRESSION: No acute abnormality.  Please see above.

## 2016-03-28 IMAGING — CR DG FOREARM 2V*L*
2 series · 2 of 2 positions shown · non-contrast
Comparison: None.

CLINICAL DATA: Laceration left forearm.  Question foreign body.

EXAM:
LEFT FOREARM - 2 VIEW

[x forearm ap left]
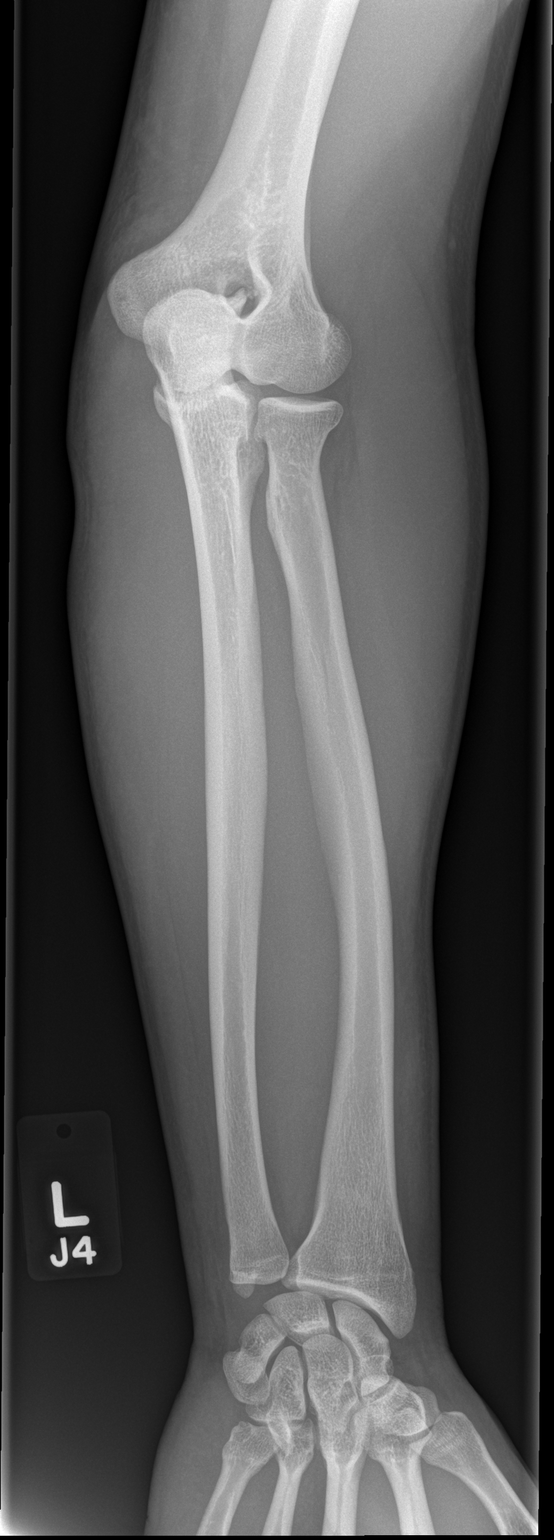

[x forearm lat left]
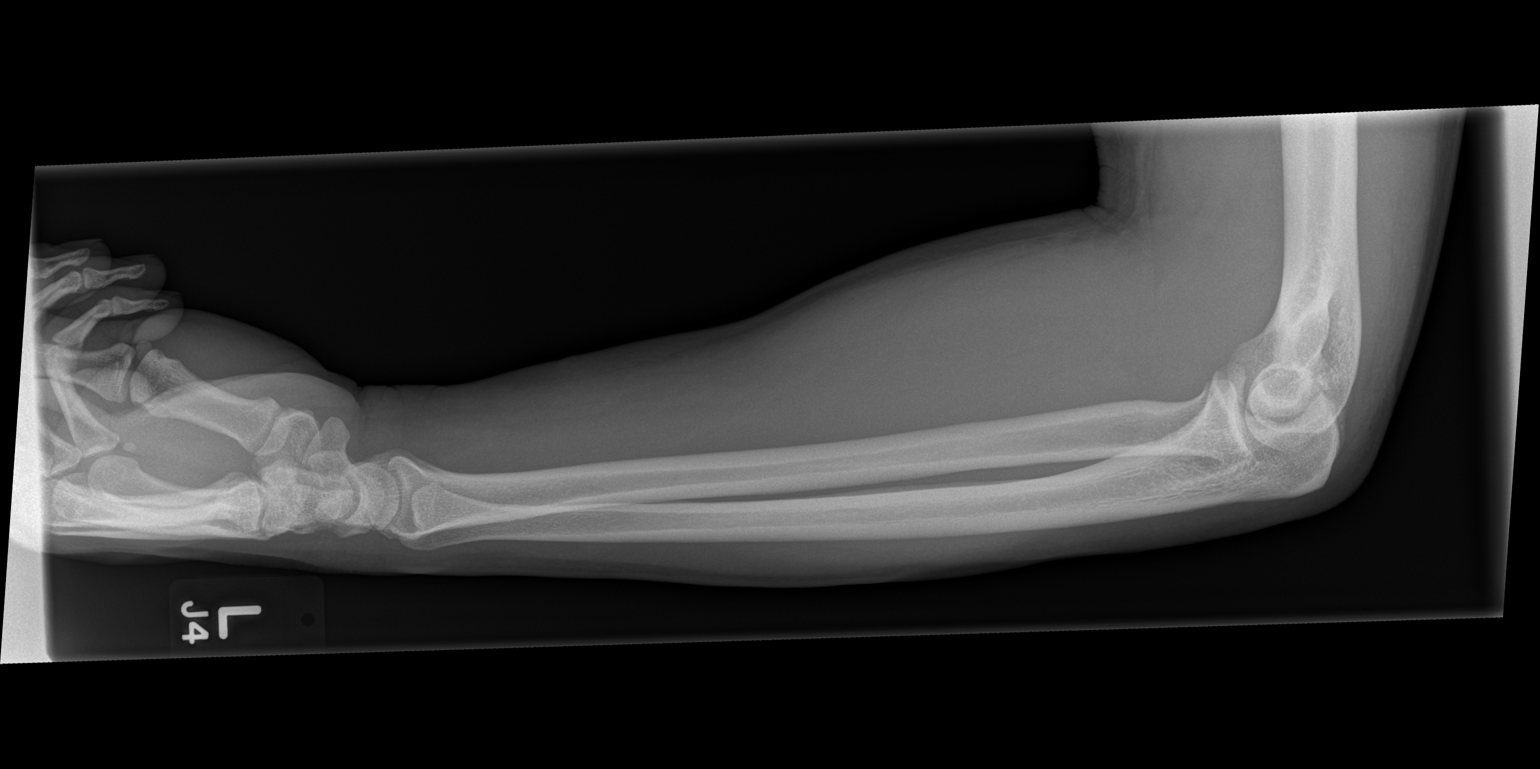

[2 of 2 positions shown; findings below may reference images not displayed]

FINDINGS: No fracture, dislocation or radiopaque foreign body is identified.
IMPRESSION: Negative for foreign body.  Negative exam.
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
